# Patient Record
Sex: Female | Born: 1965 | Race: White | Hispanic: No | Marital: Married | Smoking: Never smoker
Health system: Southern US, Community
[De-identification: ages and names within clinical notes are randomized; demographics above are authoritative.]

## PROBLEM LIST (undated history)

## (undated) DIAGNOSIS — N200 Calculus of kidney: Secondary | ICD-10-CM

## (undated) DIAGNOSIS — G43909 Migraine, unspecified, not intractable, without status migrainosus: Secondary | ICD-10-CM

## (undated) DIAGNOSIS — J302 Other seasonal allergic rhinitis: Secondary | ICD-10-CM

## (undated) DIAGNOSIS — B019 Varicella without complication: Secondary | ICD-10-CM

## (undated) DIAGNOSIS — Z79890 Hormone replacement therapy (postmenopausal): Secondary | ICD-10-CM

## (undated) HISTORY — PX: OTHER SURGICAL HISTORY: SHX169

## (undated) HISTORY — DX: Calculus of kidney: N20.0

## (undated) HISTORY — DX: Other seasonal allergic rhinitis: J30.2

## (undated) HISTORY — PX: WISDOM TOOTH EXTRACTION: SHX21

## (undated) HISTORY — DX: Migraine, unspecified, not intractable, without status migrainosus: G43.909

## (undated) HISTORY — DX: Varicella without complication: B01.9

## (undated) HISTORY — DX: Hormone replacement therapy: Z79.890

---

## 1998-12-03 ENCOUNTER — Other Ambulatory Visit: Admission: RE | Admit: 1998-12-03 | Discharge: 1998-12-03 | Payer: Self-pay | Admitting: Obstetrics and Gynecology

## 1999-01-15 ENCOUNTER — Ambulatory Visit (HOSPITAL_COMMUNITY): Admission: RE | Admit: 1999-01-15 | Discharge: 1999-01-15 | Payer: Self-pay | Admitting: Obstetrics and Gynecology

## 1999-01-15 ENCOUNTER — Encounter: Payer: Self-pay | Admitting: Obstetrics and Gynecology

## 1999-02-04 ENCOUNTER — Encounter (INDEPENDENT_AMBULATORY_CARE_PROVIDER_SITE_OTHER): Payer: Self-pay | Admitting: Specialist

## 1999-02-04 ENCOUNTER — Other Ambulatory Visit: Admission: RE | Admit: 1999-02-04 | Discharge: 1999-02-04 | Payer: Self-pay | Admitting: Otolaryngology

## 1999-11-11 ENCOUNTER — Other Ambulatory Visit: Admission: RE | Admit: 1999-11-11 | Discharge: 1999-11-11 | Payer: Self-pay | Admitting: Obstetrics and Gynecology

## 2000-07-05 HISTORY — PX: OTHER SURGICAL HISTORY: SHX169

## 2000-11-15 ENCOUNTER — Other Ambulatory Visit: Admission: RE | Admit: 2000-11-15 | Discharge: 2000-11-15 | Payer: Self-pay | Admitting: Obstetrics and Gynecology

## 2001-11-20 ENCOUNTER — Other Ambulatory Visit: Admission: RE | Admit: 2001-11-20 | Discharge: 2001-11-20 | Payer: Self-pay | Admitting: Obstetrics and Gynecology

## 2002-11-26 ENCOUNTER — Other Ambulatory Visit: Admission: RE | Admit: 2002-11-26 | Discharge: 2002-11-26 | Payer: Self-pay | Admitting: Obstetrics and Gynecology

## 2003-12-04 ENCOUNTER — Other Ambulatory Visit: Admission: RE | Admit: 2003-12-04 | Discharge: 2003-12-04 | Payer: Self-pay | Admitting: Obstetrics and Gynecology

## 2004-12-09 ENCOUNTER — Other Ambulatory Visit: Admission: RE | Admit: 2004-12-09 | Discharge: 2004-12-09 | Payer: Self-pay | Admitting: Obstetrics and Gynecology

## 2006-01-13 ENCOUNTER — Other Ambulatory Visit: Admission: RE | Admit: 2006-01-13 | Discharge: 2006-01-13 | Payer: Self-pay | Admitting: Obstetrics and Gynecology

## 2009-09-08 ENCOUNTER — Emergency Department (HOSPITAL_COMMUNITY): Admission: EM | Admit: 2009-09-08 | Discharge: 2009-09-08 | Payer: Self-pay | Admitting: Emergency Medicine

## 2010-09-28 LAB — URINALYSIS, ROUTINE W REFLEX MICROSCOPIC
Bilirubin Urine: NEGATIVE
Glucose, UA: NEGATIVE mg/dL
Hgb urine dipstick: NEGATIVE
Ketones, ur: NEGATIVE mg/dL
Nitrite: NEGATIVE
Protein, ur: NEGATIVE mg/dL
Specific Gravity, Urine: 1.017 (ref 1.005–1.030)
Urobilinogen, UA: 0.2 mg/dL (ref 0.0–1.0)
pH: 6 (ref 5.0–8.0)

## 2010-09-28 LAB — COMPREHENSIVE METABOLIC PANEL
ALT: 23 U/L (ref 0–35)
AST: 21 U/L (ref 0–37)
Albumin: 3.7 g/dL (ref 3.5–5.2)
Alkaline Phosphatase: 57 U/L (ref 39–117)
BUN: 9 mg/dL (ref 6–23)
CO2: 25 mEq/L (ref 19–32)
Calcium: 8.7 mg/dL (ref 8.4–10.5)
Chloride: 108 mEq/L (ref 96–112)
Creatinine, Ser: 0.81 mg/dL (ref 0.4–1.2)
GFR calc Af Amer: >60 mL/min (ref >60)
GFR calc non Af Amer: >60 mL/min (ref >60)
Glucose, Bld: 105 mg/dL — ABNORMAL HIGH (ref 70–99)
Potassium: 3.7 mEq/L (ref 3.5–5.1)
Sodium: 138 mEq/L (ref 135–145)
Total Bilirubin: 0.7 mg/dL (ref 0.3–1.2)
Total Protein: 7.3 g/dL (ref 6.0–8.3)

## 2010-09-28 LAB — DIFFERENTIAL
Basophils Absolute: 0 10*3/uL (ref 0.0–0.1)
Basophils Relative: 0 % (ref 0–1)
Eosinophils Absolute: 0.1 10*3/uL (ref 0.0–0.7)
Eosinophils Relative: 1 % (ref 0–5)
Monocytes Absolute: 0.6 10*3/uL (ref 0.1–1.0)

## 2010-09-28 LAB — CBC
HCT: 43.6 % (ref 36.0–46.0)
Hemoglobin: 14.6 g/dL (ref 12.0–15.0)
MCHC: 33.6 g/dL (ref 30.0–36.0)
MCV: 89.6 fL (ref 78.0–100.0)
Platelets: 218 10*3/uL (ref 150–400)
RBC: 4.86 MIL/uL (ref 3.87–5.11)
RDW: 11.8 % (ref 11.5–15.5)
WBC: 6.8 10*3/uL (ref 4.0–10.5)

## 2010-09-28 LAB — PREGNANCY, URINE: Preg Test, Ur: NEGATIVE

## 2011-09-01 ENCOUNTER — Emergency Department (HOSPITAL_COMMUNITY)
Admission: EM | Admit: 2011-09-01 | Discharge: 2011-09-01 | Disposition: A | Discharge status: Home / Self Care | Payer: BC Managed Care – PPO | Source: Home / Self Care

## 2011-09-01 ENCOUNTER — Encounter (HOSPITAL_COMMUNITY): Payer: Self-pay | Admitting: Cardiology

## 2011-09-01 DIAGNOSIS — N200 Calculus of kidney: Secondary | ICD-10-CM

## 2011-09-01 LAB — POCT URINALYSIS DIP (DEVICE)
Leukocytes, UA: NEGATIVE
Nitrite: NEGATIVE
Protein, ur: NEGATIVE mg/dL
Urobilinogen, UA: 1 mg/dL (ref 0.0–1.0)
pH: 6.5 (ref 5.0–8.0)

## 2011-09-01 MED ORDER — HYDROCODONE-ACETAMINOPHEN 5-500 MG PO TABS: 1.0000 | ORAL_TABLET | Freq: Three times a day (TID) | ORAL | Status: AC | PRN

## 2011-09-01 NOTE — Discharge Instructions (Signed)

## 2011-09-01 NOTE — ED Notes (Signed)
Pt reports sudden onset of right sided flank pain at 330pm today. She has had some frequency to urinate. Pt was nauseated today but did not vomit. She was seen by her gynecologist a couple weeks ago for similar symptoms and thought she had a UTI all tests were negative at that time. Denies fever. Urine clear. Had episode of being hot during period of pain.

## 2011-09-01 NOTE — ED Provider Notes (Signed)
Kim Woodward is a 46 y.o. female who presents to Urgent Care today for acute onset of right flank pain that started at 3:30 this afternoon. Patient states she's had about 3 weeks of urinary urgency and hesitancy. She presented to her gynecologist about 3 weeks ago for similar symptoms and told that her UA at that time was negative for UTI. These symptoms have generally waned for the past 2 weeks. She states that she was out walking this morning when she had an episode of urgency. Again hesitancy when she tried to use the restroom. As stated above at 3:30 she was attempting to urinate when she had sharp stabbing pain in right flank that radiated through to the front of abdomen. She states it is colicky in nature. It lasted about 30 minutes and then resolved. She's not had any further episodes of pain since then. She's concerned she might have a kidney stone. She has no personal history of kidney stones but does have a strong family history. No abdominal pain prior to the episode today, no nausea no vomiting. No fevers or chills. No pain currently   PMH reviewed.  ROS as above otherwise neg Medications reviewed. No current facility-administered medications for this encounter.   Current Outpatient Prescriptions  Medication Sig Dispense Refill  . Nutritional Supplements (HRT SUPPORT PO) Place onto the skin 2 (two) times a week.        Exam:  BP 112/74  Pulse 71  Temp(Src) 98.6 F (37 C) (Oral)  Resp 18  SpO2 98%  LMP 10/30/2010 Gen: Well NAD HEENT: EOMI,  MMM Lungs: CTABL Nl WOB Heart: RRR no MRG Abd: NABS, NT, ND Exts: Non edematous BL  LE, warm and well perfused.  Back:  No CVA tenderness.  Assessment and Plan: #1. Nephrolithiasis: most likely etiology. UA did show large blood. Otherwise negative. It's possible issues I passed the stone as her pain has not returned in the past 4 hours. We provided her with a urine strainer here. Also provided her with short-term course of Vicodin  for pain relief. Recommended she followup in the next several days with urology particularly at stone recurs. As she is not currently having pain did not obtain any imaging. No symptoms of pyelonephritis.

## 2011-09-03 NOTE — ED Provider Notes (Signed)
Medical screening examination/treatment/procedure(s) were performed by resident physician or non-physician practitioner and as supervising physician I was immediately available for consultation/collaboration.   Braelynne Garinger DOUGLAS MD.    Contrina Orona Douglas Odessie Polzin, MD 09/03/11 1622 

## 2011-10-29 ENCOUNTER — Other Ambulatory Visit (HOSPITAL_COMMUNITY): Payer: Self-pay | Admitting: Obstetrics and Gynecology

## 2011-10-29 DIAGNOSIS — Z1231 Encounter for screening mammogram for malignant neoplasm of breast: Secondary | ICD-10-CM

## 2011-11-24 ENCOUNTER — Ambulatory Visit (HOSPITAL_COMMUNITY)
Admission: RE | Admit: 2011-11-24 | Discharge: 2011-11-24 | Disposition: A | Discharge status: Home / Self Care | Payer: BC Managed Care – PPO | Source: Ambulatory Visit | Attending: Obstetrics and Gynecology | Admitting: Obstetrics and Gynecology

## 2011-11-24 DIAGNOSIS — Z1231 Encounter for screening mammogram for malignant neoplasm of breast: Secondary | ICD-10-CM | POA: Insufficient documentation

## 2013-12-04 ENCOUNTER — Other Ambulatory Visit: Payer: Self-pay | Admitting: Obstetrics and Gynecology

## 2013-12-04 DIAGNOSIS — R928 Other abnormal and inconclusive findings on diagnostic imaging of breast: Secondary | ICD-10-CM

## 2013-12-17 ENCOUNTER — Ambulatory Visit
Admission: RE | Admit: 2013-12-17 | Discharge: 2013-12-17 | Disposition: A | Discharge status: Home / Self Care | Payer: BC Managed Care – PPO | Source: Ambulatory Visit | Attending: Obstetrics and Gynecology | Admitting: Obstetrics and Gynecology

## 2013-12-17 DIAGNOSIS — R928 Other abnormal and inconclusive findings on diagnostic imaging of breast: Secondary | ICD-10-CM

## 2016-01-19 ENCOUNTER — Other Ambulatory Visit: Payer: Self-pay | Admitting: Obstetrics and Gynecology

## 2016-01-19 DIAGNOSIS — R928 Other abnormal and inconclusive findings on diagnostic imaging of breast: Secondary | ICD-10-CM

## 2016-02-20 ENCOUNTER — Ambulatory Visit
Admission: RE | Admit: 2016-02-20 | Discharge: 2016-02-20 | Disposition: A | Discharge status: Home / Self Care | Payer: BC Managed Care – PPO | Source: Ambulatory Visit | Attending: Obstetrics and Gynecology | Admitting: Obstetrics and Gynecology

## 2016-02-20 DIAGNOSIS — R928 Other abnormal and inconclusive findings on diagnostic imaging of breast: Secondary | ICD-10-CM

## 2016-10-15 ENCOUNTER — Encounter: Payer: Self-pay | Admitting: Primary Care

## 2016-10-15 ENCOUNTER — Ambulatory Visit (INDEPENDENT_AMBULATORY_CARE_PROVIDER_SITE_OTHER)
Admission: RE | Admit: 2016-10-15 | Discharge: 2016-10-15 | Disposition: A | Discharge status: Home / Self Care | Payer: BC Managed Care – PPO | Source: Ambulatory Visit | Attending: Primary Care | Admitting: Primary Care

## 2016-10-15 ENCOUNTER — Encounter (INDEPENDENT_AMBULATORY_CARE_PROVIDER_SITE_OTHER): Payer: Self-pay

## 2016-10-15 ENCOUNTER — Ambulatory Visit (INDEPENDENT_AMBULATORY_CARE_PROVIDER_SITE_OTHER): Payer: BC Managed Care – PPO | Admitting: Primary Care

## 2016-10-15 VITALS — BP 116/76 | HR 69 | Temp 98.3°F | Ht 63.5 in | Wt 142.8 lb

## 2016-10-15 DIAGNOSIS — R05 Cough: Secondary | ICD-10-CM

## 2016-10-15 DIAGNOSIS — R053 Chronic cough: Secondary | ICD-10-CM | POA: Insufficient documentation

## 2016-10-15 MED ORDER — ALBUTEROL SULFATE HFA 108 (90 BASE) MCG/ACT IN AERS: INHALATION_SPRAY | RESPIRATORY_TRACT | 0 refills | Status: DC

## 2016-10-15 NOTE — Patient Instructions (Signed)
Complete xray(s) prior to leaving today. I will notify you of your results once received.  Shortness of Breath/Wheezing/Cough: Use the albuterol inhaler. Inhale 2 puffs into the lungs every 4-6 hours as needed for coughing spells.  Please update me in 1 week if no improvement in your symptoms. I'll be in touch soon!  Please schedule a physical with me sometime this year. You may also schedule a lab only appointment 3-4 days prior. We will discuss your lab results in detail during your physical.  It was a pleasure to meet you today! Please don't hesitate to call me with any questions. Welcome to [facility]!

## 2016-10-15 NOTE — Assessment & Plan Note (Signed)
Discussed common non infectious etiologies of chronic cough including allergies, GERD, asthma. Given her history of improvement with Flovent and lack of GERD symptoms this is a good place to start.   Will obtain chest xray today given duration. Continue antihistamine. Start Albuterol PRN for coughing spells. If no improvement then consider Zantac OTC.

## 2016-10-15 NOTE — Progress Notes (Signed)
Pre visit review using our clinic review tool, if applicable. No additional management support is needed unless otherwise documented below in the visit note. 

## 2016-10-15 NOTE — Progress Notes (Signed)
Subjective:    Patient ID: Kim Woodward, female    DOB: [birth year], 51 y.o.   MRN: [id number]  HPI  Kim Woodward is a 51 year old female who presents today to establish care and discuss the problems mentioned below. Will obtain old records.  1) Chronic Cough: Present since late February/earlyMarch this year. Her cough is intermittent. She's also noticed mild headaches, mild sinus pressure. She's not had treatment for this cough. She does have a history of bronchitis but this feels different. Early on she felt as though she did have bronchitis.   She will experience coughing spells that occur throughout the day (mid morning, mid afternoon, and in the evening) with a productive cough (yellowish thick mucous plug, sometimes clear). She denies fevers, sore throat, fatigue, wheezing, shortness of breath, esophageal reflux symptoms, history of asthma.  She's tried taking Mucinex, Robitussin, Sudafed, saline nasal sprays, a Flovent inhaler from an old prescription, Zyrtec. She is taking some form of an antihistamine daily. The Flovent did help early on as she took for three weeks, she stopped using this and didn't notice any change in her symptoms. Historically she's noticed improvement with Flovent.   Overall she has noticed some improvement in cough, but is still bothered by coughing spells.   2) Hormone Replacement Therapy: Went through menopause at age 41. She is using the Combipatch for hormone replacement and changes twice weekly. She follows with her GYN annually who prescribes this.  Review of Systems  Constitutional: Negative for chills, fatigue and fever.  HENT: Positive for sinus pressure. Negative for congestion and ear pain.   Respiratory: Positive for cough. Negative for shortness of breath and wheezing.   Cardiovascular: Negative for chest pain.  Allergic/Immunologic: Positive for environmental allergies.  Neurological: Positive for headaches.       Past Medical  History:  Diagnosis Date  . Chickenpox   . Kidney stones   . Migraine   . Seasonal allergies      Social History   Social History  . Marital status: Married    Spouse name: N/A  . Number of children: N/A  . Years of education: N/A   Occupational History  . Not on file.   Social History Main Topics  . Smoking status: Never Smoker  . Smokeless tobacco: Never Used  . Alcohol use Yes  . Drug use: Unknown  . Sexual activity: Not on file   Other Topics Concern  . Not on file   Social History Narrative   Married.   1 child.   Works in Programme researcher, broadcasting/film/video.   Enjoys photography, spending time with family, piano.    Past Surgical History:  Procedure Laterality Date  . uterine polyps      Family History  Problem Relation Age of Onset  . Arthritis Maternal Grandmother   . Colon cancer Maternal Grandfather   . Hyperlipidemia Paternal Grandmother     No Known Allergies  No current outpatient prescriptions on file prior to visit.   No current facility-administered medications on file prior to visit.     BP 116/76   Pulse 69   Temp 98.3 F (36.8 C) (Oral)   Ht 5' 3.5" (1.613 m)   Wt 142 lb 12.8 oz (64.8 kg)   LMP 11/17/2011   SpO2 99%   BMI 24.90 kg/m    Objective:   Physical Exam  Constitutional: She appears well-nourished.  HENT:  Right Ear: Tympanic membrane and ear canal normal.  Left Ear: Tympanic membrane  and ear canal normal.  Nose: Right sinus exhibits no maxillary sinus tenderness and no frontal sinus tenderness. Left sinus exhibits no maxillary sinus tenderness and no frontal sinus tenderness.  Mouth/Throat: Oropharynx is clear and moist.  Eyes: Conjunctivae are normal.  Neck: Neck supple.  Cardiovascular: Normal rate and regular rhythm.   Pulmonary/Chest: Effort normal and breath sounds normal. She has no wheezes. She has no rales.  Lymphadenopathy:    She has no cervical adenopathy.  Skin: Skin is warm and dry.          Assessment & Plan:

## 2017-04-19 ENCOUNTER — Ambulatory Visit: Payer: BC Managed Care – PPO

## 2017-12-10 IMAGING — US ULTRASOUND LEFT BREAST LIMITED
1 series · 5 of 5 positions shown · non-contrast
Comparison: Previous exam(s).

CLINICAL DATA: Left breast asymmetry seen on most recent screening
mammography.

EXAM:
2D DIGITAL DIAGNOSTIC LEFT MAMMOGRAM WITH CAD AND ADJUNCT TOMO
ULTRASOUND LEFT BREAST

[Series 1: ultrasound left breast limited · 0.06mm/px · 5 of 5 slices shown]
[im 1/5]
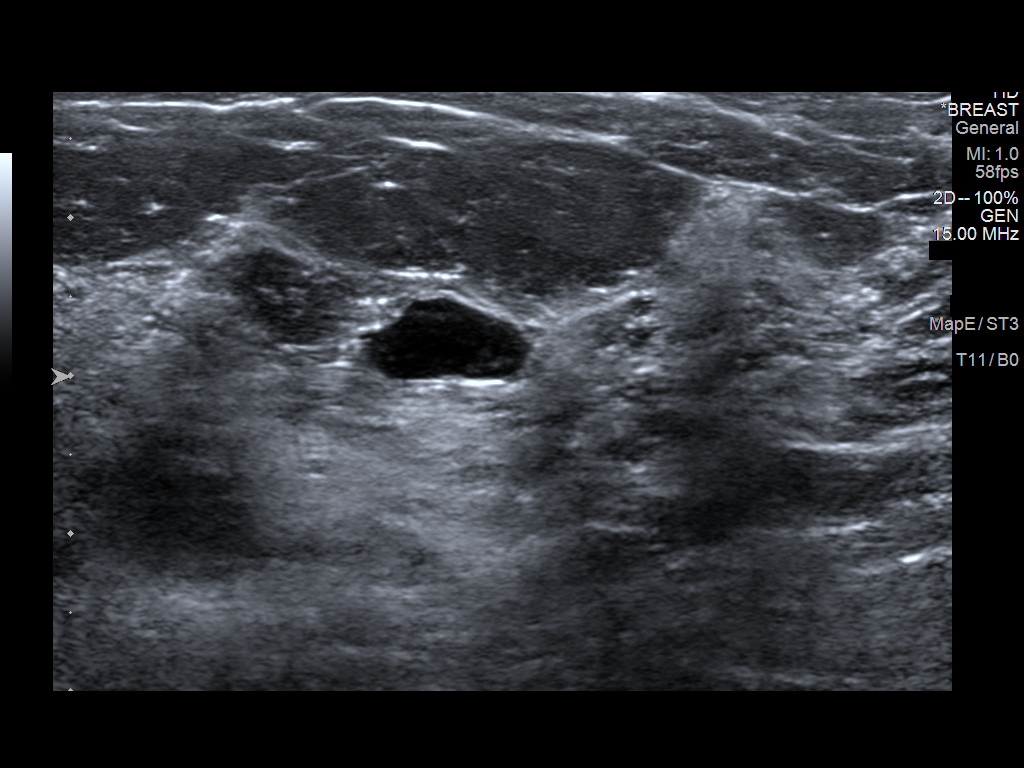
[im 2/5]
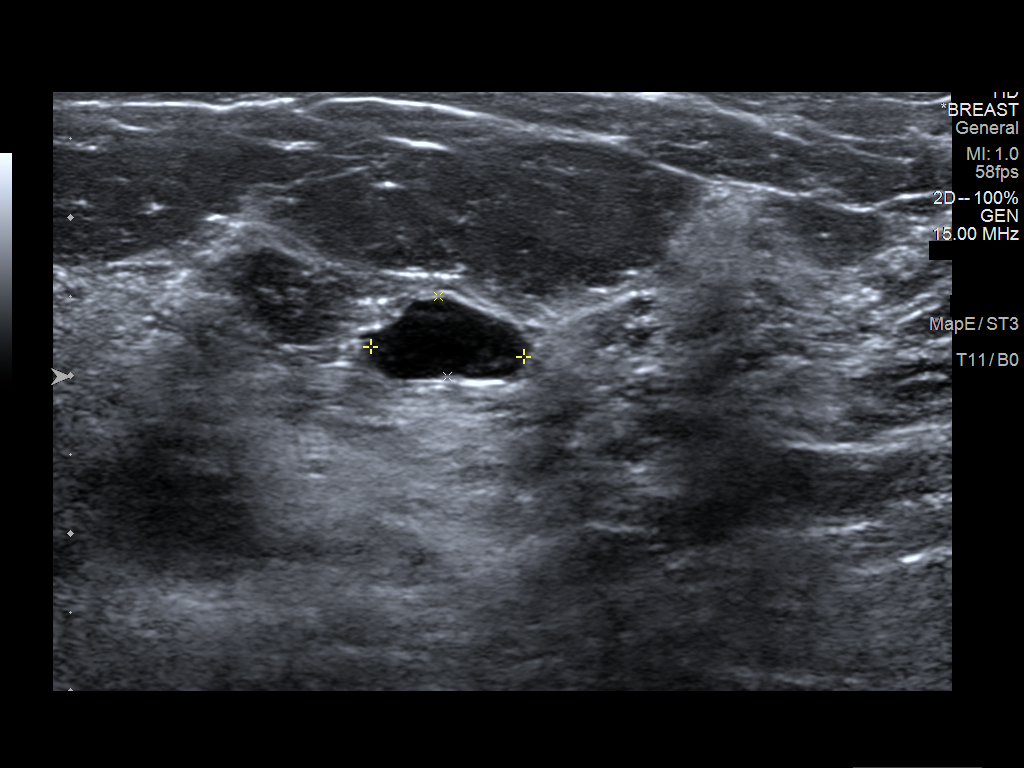
[im 3/5]
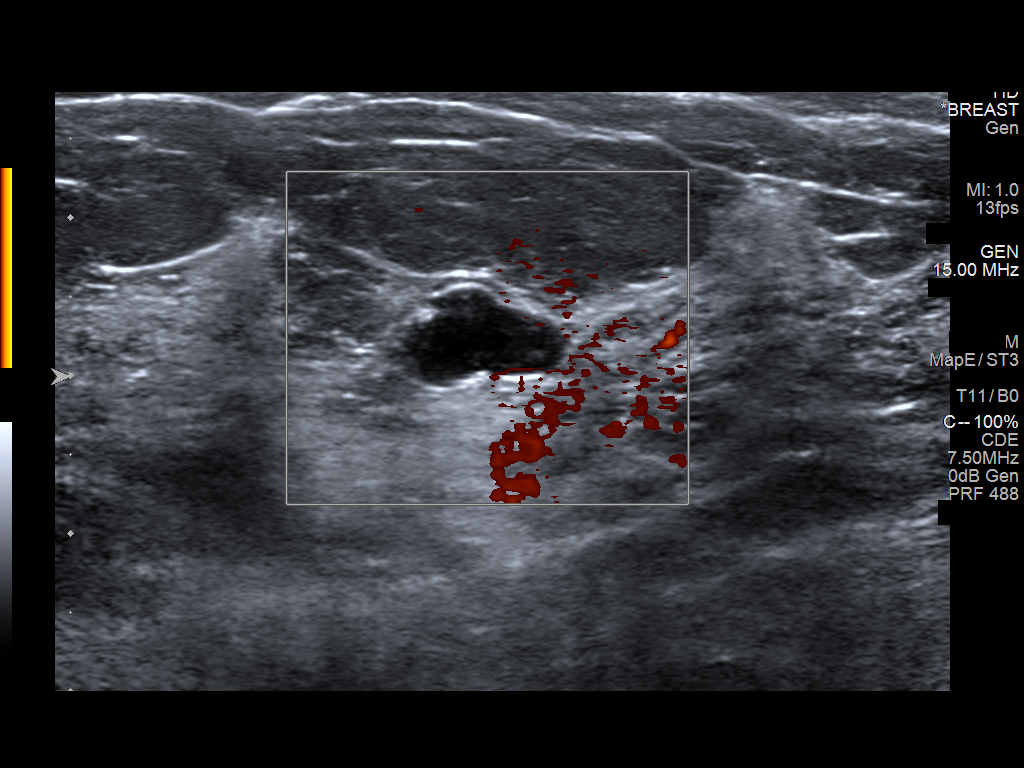
[im 4/5]
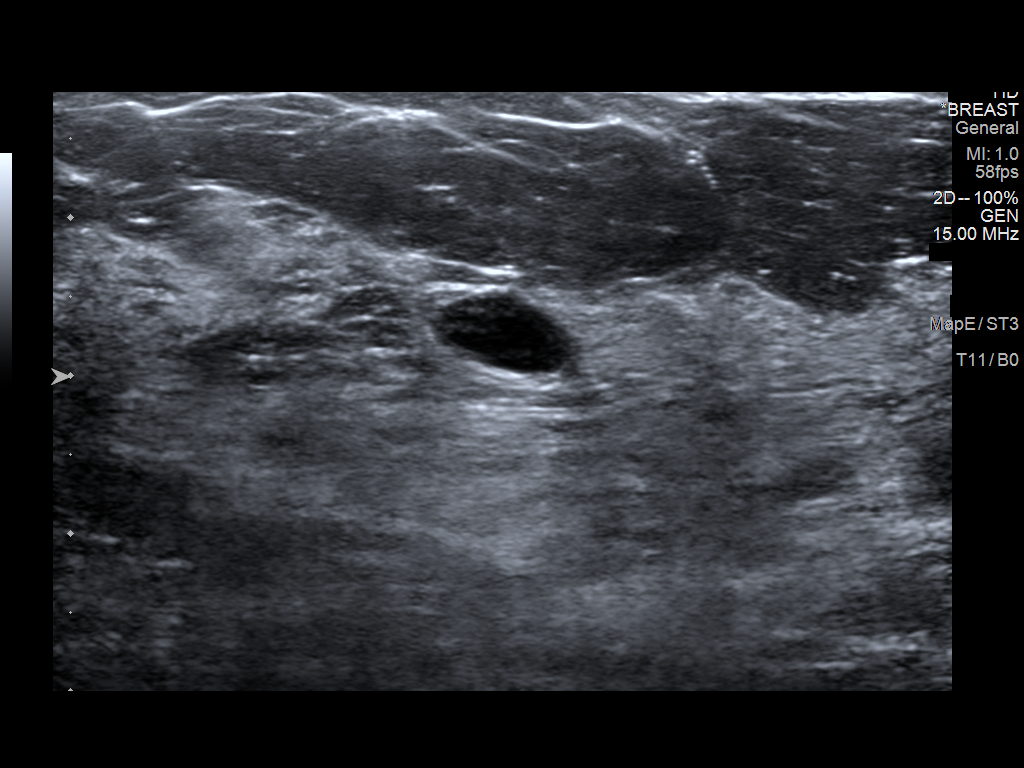
[im 5/5]
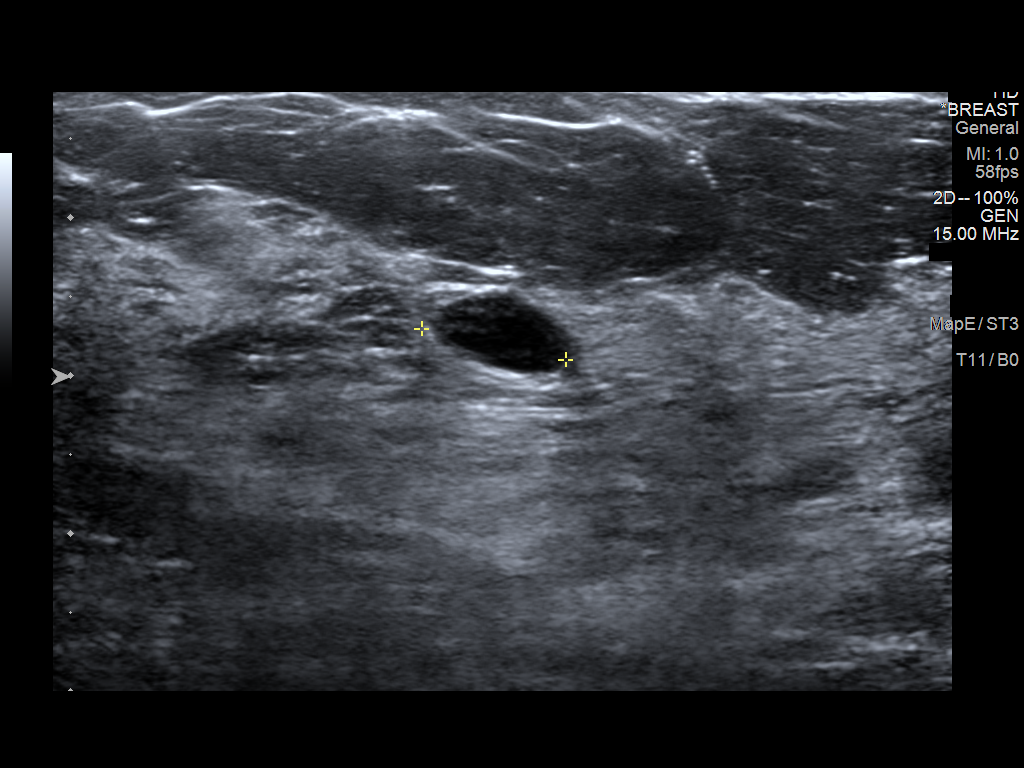

[5 of 5 positions shown; findings below may reference images not displayed]

ACR Breast Density Category c: The breast tissue is heterogeneously
dense, which may obscure small masses.
FINDINGS: Mammographically, there is a persistent circumscribed low-density
mass in the left slightly upper outer quadrant, middle depth, with
benign mammographic features.

Mammographic images were processed with CAD.

On physical exam, no suspicious findings.

Targeted ultrasound is performed, showing left breast 2 o'clock 2 cm
from the nipple benign-appearing cyst measuring 1.0 x 0.5 x 0.9 cm.
This finding corresponds to the mammographically seen mass.
IMPRESSION: No mammographic evidence of malignancy in the left breast.

Benign-appearing left breast cyst.

RECOMMENDATION:
Screening mammogram in one year.(Code:OA-4-HMR)

I have discussed the findings and recommendations with the patient.
Results were also provided in writing at the conclusion of the
visit. If applicable, a reminder letter will be sent to the patient
regarding the next appointment.

BI-RADS CATEGORY  2: Benign.

## 2018-06-09 ENCOUNTER — Ambulatory Visit: Payer: BC Managed Care – PPO | Admitting: Family Medicine

## 2018-06-09 ENCOUNTER — Encounter: Payer: Self-pay | Admitting: Family Medicine

## 2018-06-09 VITALS — BP 108/70 | HR 80 | Temp 98.7°F | Ht 63.5 in | Wt 145.8 lb

## 2018-06-09 DIAGNOSIS — J029 Acute pharyngitis, unspecified: Secondary | ICD-10-CM | POA: Insufficient documentation

## 2018-06-09 LAB — POCT RAPID STREP A (OFFICE): Rapid Strep A Screen: NEGATIVE

## 2018-06-09 NOTE — Progress Notes (Signed)
BP 108/70   Pulse 80   Temp 98.7 F (37.1 C) (Oral)   Ht 5' 3.5" (1.613 m)   Wt 145 lb 12 oz (66.1 kg)   LMP 11/17/2011   SpO2 98%   BMI 25.41 kg/m    CC: "very very sore throat" Subjective:    Patient ID: Kim Woodward, female    DOB: 03-15-1966, 52 y.o.   MRN: [id number]  HPI: Kim Woodward is a 52 y.o. female presenting on 06/09/2018 for Possible Strep   3d h/o bad ST, mild congestion, rhinorrhea, minimal cough. Yesterday had significant back pain. Some pressure in ears today.   No fevers/chills, headache, dyspnea or wheezing, tooth pain, PNdrainage. No abd pain or nausea.   Treating with ibuprofen with benefit. Tried airborn and zinc.   No sick contacts at home.  She did receive flu shot.  Non smoker  No h/o asthma.   Relevant past medical, surgical, family and social history reviewed and updated as indicated. Interim medical history since our last visit reviewed. Allergies and medications reviewed and updated. Outpatient Medications Prior to Visit  Medication Sig Dispense Refill  . albuterol (PROAIR HFA) 108 (90 Base) MCG/ACT inhaler Inhale 2 puffs into the lungs every four hours as needed for coughing spells. 1 Inhaler 0  . cetirizine (ZYRTEC) 10 MG tablet Take 10 mg by mouth daily.    . COMBIPATCH 0.05-0.14 MG/DAY Place 1 patch onto the skin 2 (two) times a week.     . fluticasone (FLONASE) 50 MCG/ACT nasal spray Place 1 spray into both nostrils daily.    . Multiple Vitamin (MULTIVITAMIN) tablet Take 1 tablet by mouth daily.     No facility-administered medications prior to visit.      Per HPI unless specifically indicated in ROS section below Review of Systems     Objective:    BP 108/70   Pulse 80   Temp 98.7 F (37.1 C) (Oral)   Ht 5' 3.5" (1.613 m)   Wt 145 lb 12 oz (66.1 kg)   LMP 11/17/2011   SpO2 98%   BMI 25.41 kg/m   Wt Readings from Last 3 Encounters:  06/09/18 145 lb 12 oz (66.1 kg)  10/15/16 142 lb 12.8 oz (64.8 kg)    Physical Exam  Constitutional: She appears well-developed and well-nourished. No distress.  HENT:  Head: Normocephalic and atraumatic.  Right Ear: Hearing, tympanic membrane, external ear and ear canal normal.  Left Ear: Hearing, tympanic membrane, external ear and ear canal normal.  Nose: Mucosal edema and rhinorrhea present. Right sinus exhibits no maxillary sinus tenderness and no frontal sinus tenderness. Left sinus exhibits no maxillary sinus tenderness and no frontal sinus tenderness.  Mouth/Throat: Uvula is midline and mucous membranes are normal. Posterior oropharyngeal edema and posterior oropharyngeal erythema present. No oropharyngeal exudate or tonsillar abscesses.  Eyes: Pupils are equal, round, and reactive to light. Conjunctivae and EOM are normal. No scleral icterus.  Neck: Normal range of motion. Neck supple.  Cardiovascular: Normal rate, regular rhythm, normal heart sounds and intact distal pulses.  No murmur heard. Pulmonary/Chest: Effort normal and breath sounds normal. No respiratory distress. She has no wheezes. She has no rales.  Lymphadenopathy:    She has no cervical adenopathy.  Skin: Skin is warm and dry. No rash noted.  Nursing note and vitals reviewed.  Results for orders placed or performed in visit on 06/09/18  POCT rapid strep A  Result Value Ref Range   Rapid Strep  A Screen Negative Negative      Assessment & Plan:   Problem List Items Addressed This Visit    Acute pharyngitis - Primary    Suspicious for strep pharyngitis given lack of URI symptoms and severity of ST, vs flu like illness. RST today negative. Will send throat culture.  Supportive care reviewed as per instructions including alternating tylenol/ibuprofen.  Update if not improving with treatment.       Relevant Orders   Culture, Group A Strep   POCT rapid strep A (Completed)       No orders of the defined types were placed in this encounter.  Orders Placed This Encounter   Procedures  . Culture, Group A Strep    Order Specific Question:   Source    Answer:   throat  . POCT rapid strep A    Follow up plan: No follow-ups on file.  Eustaquio BoydenJavier Jahniya Duzan, MD

## 2018-06-09 NOTE — Assessment & Plan Note (Addendum)
Suspicious for strep pharyngitis given lack of URI symptoms and severity of ST, vs flu like illness. RST today negative. Will send throat culture.  Supportive care reviewed as per instructions including alternating tylenol/ibuprofen.  Update if not improving with treatment.

## 2018-06-09 NOTE — Patient Instructions (Addendum)
Strep test returned negative. We will send strep culture as well.  You do have acute pharyngitis.  Push fluids and plenty of rest. Continue ibuprofen 600mg  with meals, may alternate every 4 hours with tylenol 1000mg  three times a day.  Salt water gargles. Suck on cold things like popsicles or warm things like herbal teas (whichever soothes the throat better).  Return if fever >101.5, worsening pain, or trouble opening/closing mouth, or hoarse voice. Good to see you today, call clinic with questions.

## 2018-06-11 LAB — CULTURE, GROUP A STREP
MICRO NUMBER:: 91463890
SPECIMEN QUALITY:: ADEQUATE

## 2018-12-13 ENCOUNTER — Ambulatory Visit: Payer: BC Managed Care – PPO | Admitting: Primary Care

## 2018-12-13 ENCOUNTER — Encounter: Payer: Self-pay | Admitting: Primary Care

## 2018-12-13 ENCOUNTER — Telehealth: Payer: BC Managed Care – PPO | Admitting: Nurse Practitioner

## 2018-12-13 ENCOUNTER — Ambulatory Visit (INDEPENDENT_AMBULATORY_CARE_PROVIDER_SITE_OTHER): Payer: BC Managed Care – PPO | Admitting: Primary Care

## 2018-12-13 DIAGNOSIS — L299 Pruritus, unspecified: Secondary | ICD-10-CM

## 2018-12-13 NOTE — Progress Notes (Signed)
Subjective:    Patient ID: Kim Woodward, female    DOB: Feb 18, 1966, 53 y.o.   MRN: [id number]  HPI  Virtual Visit via Video Note  I connected with Kim Woodward on 12/13/18 at  3:40 PM EDT by a video enabled telemedicine application and verified that I am speaking with the correct person using two identifiers.  Location: Patient: Home Provider: Office   I discussed the limitations of evaluation and management by telemedicine and the availability of in person appointments. The patient expressed understanding and agreed to proceed.  History of Present Illness:  Ms. Kim Woodward is a 53 year old female who presents today with a chief complaint of itchy scalp.  Her itchy sensation is located to the upper through lower bilateral occipital scalp. She will notice infrequent mild tingling and burning sensation if she accidentally hits her head with the hair dryer, but her main symptom is itching. She's had her husband look at the skin to her scalp and denies rashes, dry skin, flaking, lesions. She's not changed soaps, detergents, shampoo/conditioner. She's tried using dandruff shampoo and reducing the frequency of washing her hair without improvement.  She is compliant to her Zyrtec daily. Today she's feeling nearly 100% better. No one else in her house is itching.     Observations/Objective:  Alert and oriented. Appears well, not sickly. No distress. Speaking in complete sentences. No obvious rash or lesion.  Assessment and Plan:  Unclear etiology. No rash, lesions, dry skin. Doesn't appear to be shingles, but will keep on differential list.  Today she her symptoms have nearly resolved. If itching returns consider adding Pepcid and or treating for tinea capitus.  If burning becomes more pronounced then consider Valtrex course. She will update later this week.   Follow Up Instructions:  Please update me if your itching returns, you notice more burning, you notice  lesions or rashes.  It was a pleasure to see you today!   I discussed the assessment and treatment plan with the patient. The patient was provided an opportunity to ask questions and all were answered. The patient agreed with the plan and demonstrated an understanding of the instructions.   The patient was advised to call back or seek an in-person evaluation if the symptoms worsen or if the condition fails to improve as anticipated.    Pleas Koch, NP    Review of Systems  Constitutional: Negative for fever.  Skin: Negative for rash and wound.       Itchy skin to scalp  Neurological: Negative for numbness.       Past Medical History:  Diagnosis Date  . Chickenpox   . Hormone replacement therapy (HRT)   . Kidney stones   . Migraine   . Seasonal allergies      Social History   Socioeconomic History  . Marital status: Married    Spouse name: Not on file  . Number of children: Not on file  . Years of education: Not on file  . Highest education level: Not on file  Occupational History  . Not on file  Social Needs  . Financial resource strain: Not on file  . Food insecurity:    Worry: Not on file    Inability: Not on file  . Transportation needs:    Medical: Not on file    Non-medical: Not on file  Tobacco Use  . Smoking status: Never Smoker  . Smokeless tobacco: Never Used  Substance and Sexual Activity  .  Alcohol use: Yes  . Drug use: Not on file  . Sexual activity: Not on file  Lifestyle  . Physical activity:    Days per week: Not on file    Minutes per session: Not on file  . Stress: Not on file  Relationships  . Social connections:    Talks on phone: Not on file    Gets together: Not on file    Attends religious service: Not on file    Active member of club or organization: Not on file    Attends meetings of clubs or organizations: Not on file    Relationship status: Not on file  . Intimate partner violence:    Fear of current or ex partner:  Not on file    Emotionally abused: Not on file    Physically abused: Not on file    Forced sexual activity: Not on file  Other Topics Concern  . Not on file  Social History Narrative   Married.   1 child.   Works in Programme researcher, broadcasting/film/videoducation.   Enjoys photography, spending time with family, piano.    Past Surgical History:  Procedure Laterality Date  . uterine polyps      Family History  Problem Relation Age of Onset  . Arthritis Maternal Grandmother   . Colon cancer Maternal Grandfather   . Hyperlipidemia Paternal Grandmother     No Known Allergies  Current Outpatient Medications on File Prior to Visit  Medication Sig Dispense Refill  . albuterol (PROAIR HFA) 108 (90 Base) MCG/ACT inhaler Inhale 2 puffs into the lungs every four hours as needed for coughing spells. 1 Inhaler 0  . cetirizine (ZYRTEC) 10 MG tablet Take 10 mg by mouth daily.    . COMBIPATCH 0.05-0.14 MG/DAY Place 1 patch onto the skin 2 (two) times a week.     . fluticasone (FLONASE) 50 MCG/ACT nasal spray Place 1 spray into both nostrils daily.    . Multiple Vitamin (MULTIVITAMIN) tablet Take 1 tablet by mouth daily.     No current facility-administered medications on file prior to visit.     Wt 150 lb (68 kg)   LMP 11/17/2011   BMI 26.15 kg/m    Objective:   Physical Exam  Constitutional: She is oriented to person, place, and time. She appears well-nourished.  Respiratory: Effort normal.  Neurological: She is alert and oriented to person, place, and time.  Skin: No rash noted.  No flaking skin  Psychiatric: She has a normal mood and affect.           Assessment & Plan:

## 2018-12-13 NOTE — Patient Instructions (Signed)
Please update me if your itching returns, you notice more burning, you notice lesions or rashes.  It was a pleasure to see you today!

## 2018-12-13 NOTE — Assessment & Plan Note (Signed)
Unclear etiology. No rash, lesions, dry skin. Doesn't appear to be shingles, but will keep on differential list.  Today she her symptoms have nearly resolved. If itching returns consider adding Pepcid and or treating for tinea capitus.  If burning becomes more pronounced then consider Valtrex course. She will update later this week.

## 2018-12-13 NOTE — Progress Notes (Signed)
Based on what you shared with me it looks like you have an itch,that should be evaluated in a face to face office visit. I cannot determine cause without being seen. I do not feel lik ethis is an emergency that you need to go to ER or urgent care. Just call your PCP and make appointment.   NOTE: If you entered your credit card information for this eVisit, you will not be charged. You may see a "hold" on your card for the $30 but that hold will drop off and you will not have a charge processed.  If you are having a true medical emergency please call 911.  If you need an urgent face to face visit, [facility] has four urgent care centers for your convenience.

## 2019-01-03 ENCOUNTER — Telehealth: Payer: Self-pay | Admitting: Primary Care

## 2019-01-03 NOTE — Telephone Encounter (Signed)
Please call pt to set up shingrix vaccine from wait list. Best dates are 7/14, 7/21, 7/28, 8/4, 8/11. Slots are 1130, 1145, 1200, 230, 245, 300, 315, 330. °

## 2019-01-10 NOTE — Telephone Encounter (Signed)
Lvm asking pt to call office 

## 2019-01-22 NOTE — Telephone Encounter (Signed)
lvm asking patient to call office °

## 2019-04-20 LAB — HM MAMMOGRAPHY

## 2019-05-02 LAB — HM PAP SMEAR: HM Pap smear: NORMAL

## 2019-05-03 ENCOUNTER — Other Ambulatory Visit: Payer: Self-pay | Admitting: Obstetrics and Gynecology

## 2019-05-03 DIAGNOSIS — N6310 Unspecified lump in the right breast, unspecified quadrant: Secondary | ICD-10-CM

## 2019-05-15 ENCOUNTER — Other Ambulatory Visit: Payer: BC Managed Care – PPO

## 2019-05-22 ENCOUNTER — Other Ambulatory Visit: Payer: Self-pay

## 2019-05-22 ENCOUNTER — Ambulatory Visit
Admission: RE | Admit: 2019-05-22 | Discharge: 2019-05-22 | Disposition: A | Discharge status: Home / Self Care | Payer: BC Managed Care – PPO | Source: Ambulatory Visit | Attending: Obstetrics and Gynecology | Admitting: Obstetrics and Gynecology

## 2019-05-22 DIAGNOSIS — N6310 Unspecified lump in the right breast, unspecified quadrant: Secondary | ICD-10-CM

## 2020-01-04 ENCOUNTER — Ambulatory Visit: Payer: BC Managed Care – PPO

## 2020-05-09 ENCOUNTER — Encounter: Payer: Self-pay | Admitting: Primary Care

## 2020-05-12 ENCOUNTER — Encounter: Payer: Self-pay | Admitting: Primary Care

## 2021-03-11 IMAGING — US US BREAST*R* LIMITED INC AXILLA
1 series · 7 of 7 positions shown · non-contrast
Comparison: Previous exam(s).

CLINICAL DATA: Screening recall for a possible right breast mass.

EXAM:
ULTRASOUND OF THE RIGHT BREAST

[Series 1: us breast*right* limited inc axilla · 0.06mm/px · 7 of 7 slices shown]
[im 1/7]
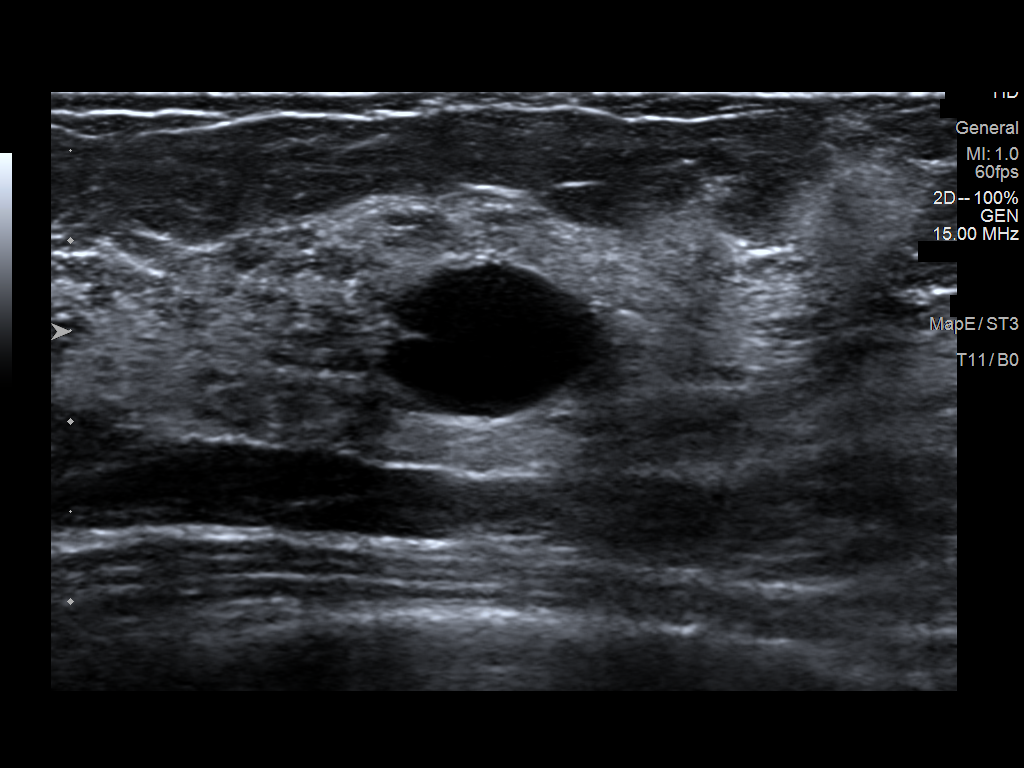
[im 2/7]
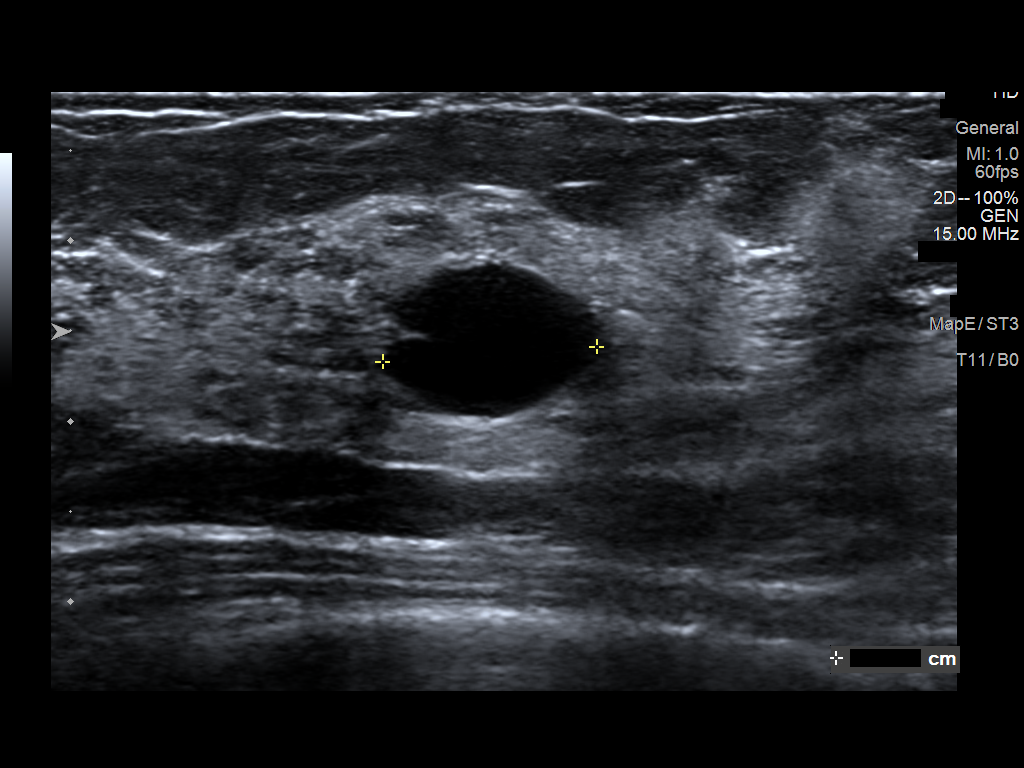
[im 3/7]
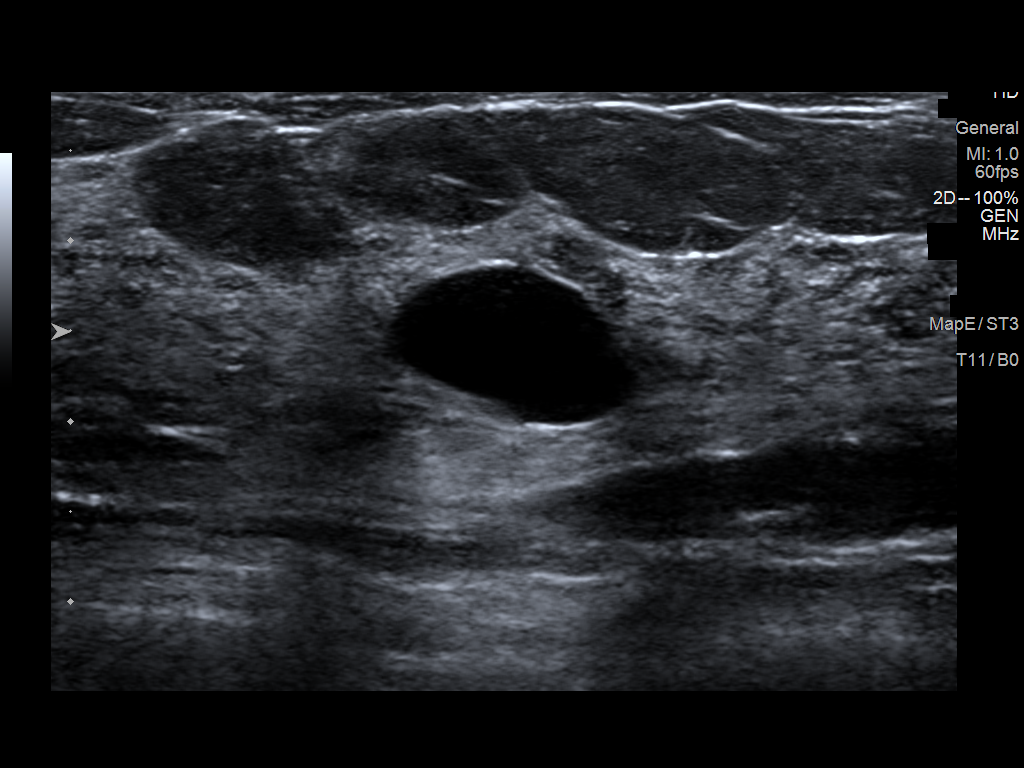
[im 4/7]
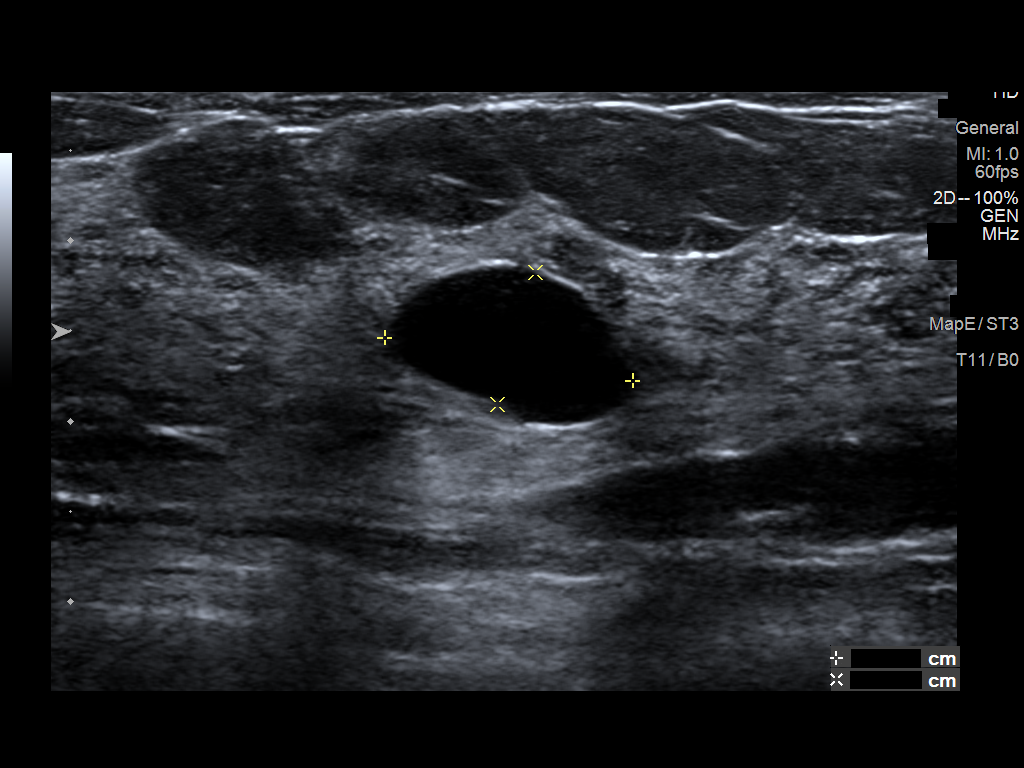
[im 5/7]
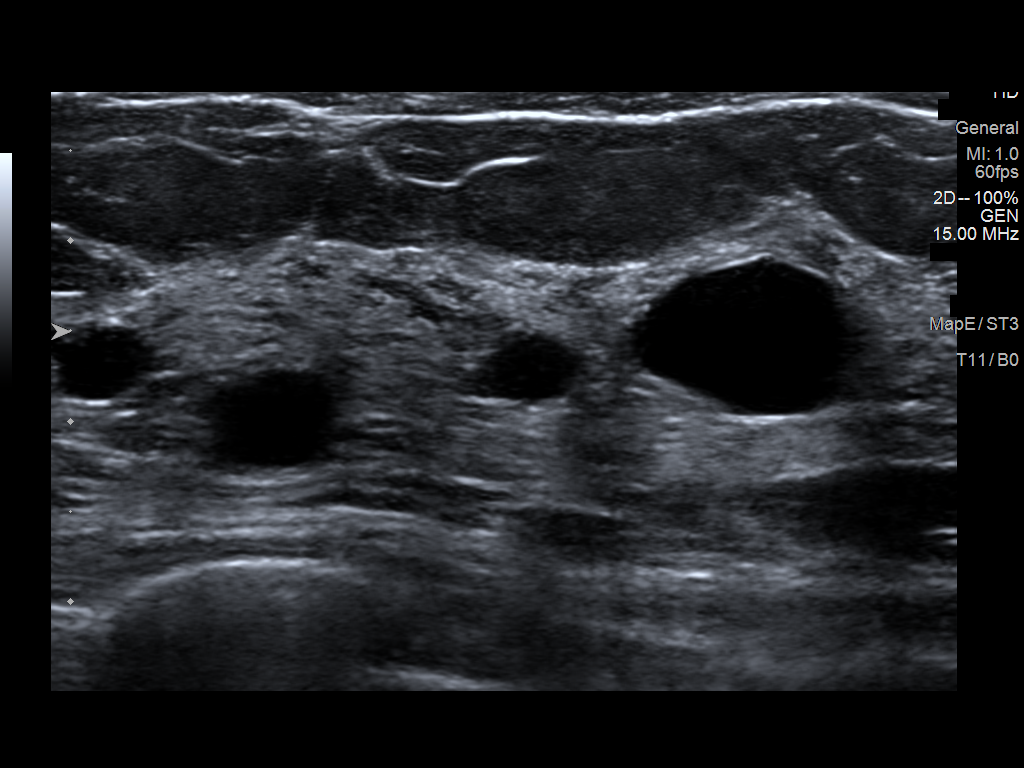
[im 6/7]
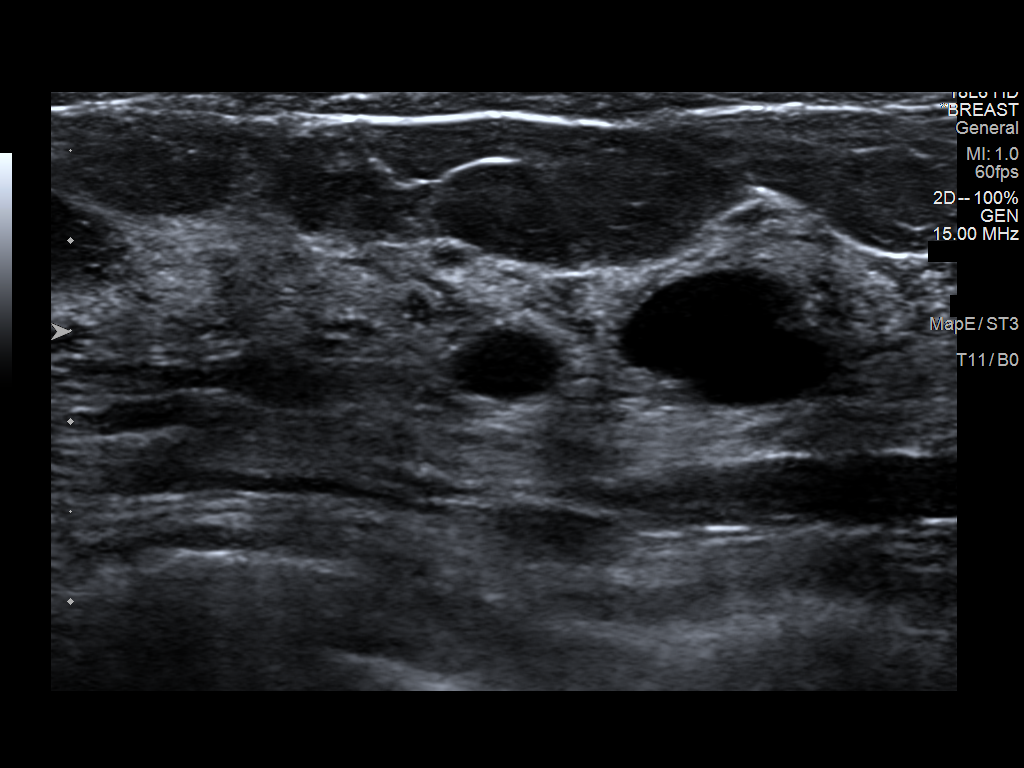
[im 7/7]
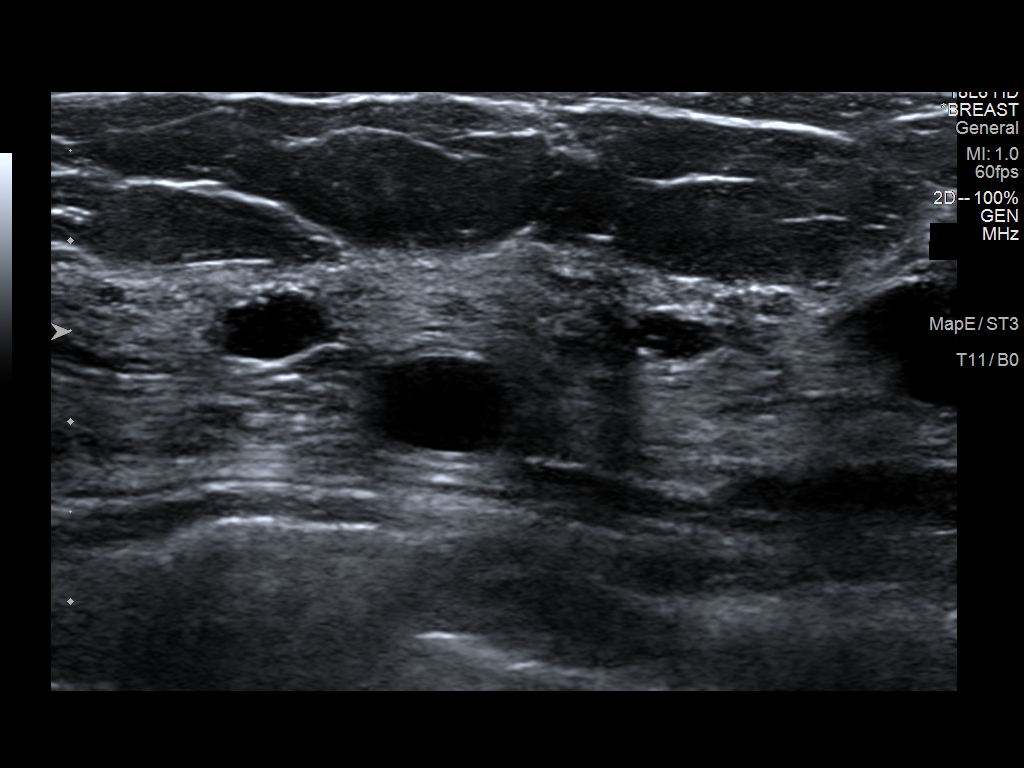

[7 of 7 positions shown; findings below may reference images not displayed]

FINDINGS: Ultrasound of the right breast at 10 o'clock demonstrates multiple
anechoic oval circumscribed masses. The largest measures 1.4 x 0.8 x
1.2 cm.
IMPRESSION: The mass in the right breast on the screening mammogram corresponds
with a benign cyst.

RECOMMENDATION:
Screening mammogram in one year.(Code:AV-P-ZE1)

I have discussed the findings and recommendations with the patient.
If applicable, a reminder letter will be sent to the patient
regarding the next appointment.

BI-RADS CATEGORY  2: Benign.

## 2021-10-30 ENCOUNTER — Encounter: Payer: Self-pay | Admitting: Family Medicine

## 2021-10-30 ENCOUNTER — Ambulatory Visit: Payer: BC Managed Care – PPO | Admitting: Family Medicine

## 2021-10-30 VITALS — BP 90/60 | HR 77 | Temp 98.3°F | Ht 63.5 in | Wt 135.2 lb

## 2021-10-30 DIAGNOSIS — Z20818 Contact with and (suspected) exposure to other bacterial communicable diseases: Secondary | ICD-10-CM | POA: Diagnosis not present

## 2021-10-30 DIAGNOSIS — L539 Erythematous condition, unspecified: Secondary | ICD-10-CM | POA: Diagnosis not present

## 2021-10-30 LAB — POCT RAPID STREP A (OFFICE): Rapid Strep A Screen: NEGATIVE

## 2021-10-30 MED ORDER — AZITHROMYCIN 250 MG PO TABS: ORAL_TABLET | ORAL | 0 refills | Status: DC

## 2021-10-30 NOTE — Progress Notes (Signed)
? ? Patient ID: Kim Woodward, female    DOB: 05-Apr-1966, 56 y.o.   MRN: [id number] ? ?This visit was conducted in person. ? ?BP 90/60   Pulse 77   Temp 98.3 ?F (36.8 ?C) (Oral)   Ht 5' 3.5" (1.613 m)   Wt 135 lb 4 oz (61.3 kg)   LMP 11/17/2011   SpO2 98%   BMI 23.58 kg/m?   ? ?CC:  ?Chief Complaint  ?Patient presents with  ? Red Throat  ?  Family Members have tested positive for strep  ? ? ?Subjective:  ? ?HPI: ?Kim Woodward is a 56 y.o. female presenting on 10/30/2021 for Red Throat (Family Members have tested positive for strep) ? ? ? Family started with ST in last several weeks. ? Multiple family members tested positive in last several weeks. ? ? Date of onset:   She does not feel bad but noted red throat x 2 days. ? Some  white mucus on tonsils. ? NO ST, no fever  ? No congestion, no cough. ? ?  Has not taken any medication. ? ?   ? ?Relevant past medical, surgical, family and social history reviewed and updated as indicated. Interim medical history since our last visit reviewed. ?Allergies and medications reviewed and updated. ?Outpatient Medications Prior to Visit  ?Medication Sig Dispense Refill  ? Ascorbic Acid (VITAMIN C) 1000 MG tablet Take 1,000 mg by mouth daily.    ? cetirizine (ZYRTEC) 10 MG tablet Take 10 mg by mouth daily.    ? Cholecalciferol (VITAMIN D3) 50 MCG (2000 UT) TABS Take 1 tablet by mouth daily.    ? COLLAGEN PO Take 2 tablets by mouth daily.    ? COMBIPATCH 0.05-0.14 MG/DAY Place 1 patch onto the skin 2 (two) times a week.     ? fluticasone (FLONASE) 50 MCG/ACT nasal spray Place 1 spray into both nostrils daily.    ? GLUCOSAMINE-CHONDROITIN PO Take 1 tablet by mouth daily.    ? Multiple Vitamin (MULTIVITAMIN) tablet Take 1 tablet by mouth daily.    ? albuterol (PROAIR HFA) 108 (90 Base) MCG/ACT inhaler Inhale 2 puffs into the lungs every four hours as needed for coughing spells. 1 Inhaler 0  ? ?No facility-administered medications prior to visit.  ?  ? ?Per HPI  unless specifically indicated in ROS section below ?Review of Systems  ?Constitutional:  Negative for fatigue and fever.  ?HENT:  Negative for congestion.   ?Eyes:  Negative for pain.  ?Respiratory:  Negative for cough and shortness of breath.   ?Cardiovascular:  Negative for chest pain, palpitations and leg swelling.  ?Gastrointestinal:  Negative for abdominal pain.  ?Genitourinary:  Negative for dysuria and vaginal bleeding.  ?Musculoskeletal:  Negative for back pain.  ?Neurological:  Negative for syncope, light-headedness and headaches.  ?Psychiatric/Behavioral:  Negative for dysphoric mood.   ?Objective:  ?BP 90/60   Pulse 77   Temp 98.3 ?F (36.8 ?C) (Oral)   Ht 5' 3.5" (1.613 m)   Wt 135 lb 4 oz (61.3 kg)   LMP 11/17/2011   SpO2 98%   BMI 23.58 kg/m?   ?Wt Readings from Last 3 Encounters:  ?10/30/21 135 lb 4 oz (61.3 kg)  ?12/13/18 150 lb (68 kg)  ?06/09/18 145 lb 12 oz (66.1 kg)  ?  ?  ?Physical Exam ?Constitutional:   ?   General: She is not in acute distress. ?   Appearance: Normal appearance. She is well-developed. She is not ill-appearing or  toxic-appearing.  ?HENT:  ?   Head: Normocephalic.  ?   Right Ear: Hearing, tympanic membrane, ear canal and external ear normal. Tympanic membrane is not erythematous, retracted or bulging.  ?   Left Ear: Hearing, tympanic membrane, ear canal and external ear normal. Tympanic membrane is not erythematous, retracted or bulging.  ?   Nose: No mucosal edema or rhinorrhea.  ?   Right Sinus: No maxillary sinus tenderness or frontal sinus tenderness.  ?   Left Sinus: No maxillary sinus tenderness or frontal sinus tenderness.  ?   Mouth/Throat:  ?   Pharynx: Uvula midline. Posterior oropharyngeal erythema present. No pharyngeal swelling, oropharyngeal exudate or uvula swelling.  ?Eyes:  ?   General: Lids are normal. Lids are everted, no foreign bodies appreciated.  ?   Conjunctiva/sclera: Conjunctivae normal.  ?   Pupils: Pupils are equal, round, and reactive to  light.  ?Neck:  ?   Thyroid: No thyroid mass or thyromegaly.  ?   Vascular: No carotid bruit.  ?   Trachea: Trachea normal.  ?Cardiovascular:  ?   Rate and Rhythm: Normal rate and regular rhythm.  ?   Pulses: Normal pulses.  ?   Heart sounds: Normal heart sounds, S1 normal and S2 normal. No murmur heard. ?  No friction rub. No gallop.  ?Pulmonary:  ?   Effort: Pulmonary effort is normal. No tachypnea or respiratory distress.  ?   Breath sounds: Normal breath sounds. No decreased breath sounds, wheezing, rhonchi or rales.  ?Abdominal:  ?   General: Bowel sounds are normal.  ?   Palpations: Abdomen is soft.  ?   Tenderness: There is no abdominal tenderness.  ?Musculoskeletal:  ?   Cervical back: Normal range of motion and neck supple.  ?Skin: ?   General: Skin is warm and dry.  ?   Findings: No rash.  ?Neurological:  ?   Mental Status: She is alert.  ?Psychiatric:     ?   Mood and Affect: Mood is not anxious or depressed.     ?   Speech: Speech normal.     ?   Behavior: Behavior normal. Behavior is cooperative.     ?   Thought Content: Thought content normal.     ?   Judgment: Judgment normal.  ? ?   ?Results for orders placed or performed in visit on 10/30/21  ?POCT rapid strep A  ?Result Value Ref Range  ? Rapid Strep A Screen Negative Negative  ? ? ?This visit occurred during the SARS-CoV-2 public health emergency.  Safety protocols were in place, including screening questions prior to the visit, additional usage of staff PPE, and extensive cleaning of exam room while observing appropriate contact time as indicated for disinfecting solutions.  ? ?COVID 19 screen:  No recent travel or known exposure to COVID19 ?The patient denies respiratory symptoms of COVID 19 at this time. ?The importance of social distancing was discussed today.  ? ?Assessment and Plan ?Problem List Items Addressed This Visit   ?None ?Visit Diagnoses   ? ? Exposure to strep throat    -  Primary  ? Relevant Orders  ? POCT rapid strep A  (Completed)  ? Culture, Group A Strep  ? Oropharynx erythematous      ? Relevant Orders  ? Culture, Group A Strep  ? ?  ? ?Rapid strep test was negative but given multiple exposures in multiple family members, we will send throat culture.  If throat  culture comes back over the weekend she will fill the course of antibiotics to treat.  We will hold off empiric treatment unless her symptoms significantly worsen.  Azithromycin was chosen given her predisposition toGI and vaginal complaints with antibiotics. ? ?. ?Kerby Nora, MD  ? ?

## 2021-10-30 NOTE — Patient Instructions (Signed)
We will call with throat culture results. ? If sore throat starts or culture returns positive.Marland Kitchen go ahead and  take the antibiotics course. ?

## 2021-11-01 LAB — CULTURE, GROUP A STREP
MICRO NUMBER:: 13326585
SPECIMEN QUALITY:: ADEQUATE

## 2021-12-04 ENCOUNTER — Encounter: Payer: Self-pay | Admitting: Family

## 2021-12-04 ENCOUNTER — Ambulatory Visit: Payer: BC Managed Care – PPO | Admitting: Family

## 2021-12-04 VITALS — BP 92/60 | HR 75 | Temp 99.1°F | Resp 16 | Ht 63.5 in | Wt 134.4 lb

## 2021-12-04 DIAGNOSIS — L219 Seborrheic dermatitis, unspecified: Secondary | ICD-10-CM | POA: Diagnosis not present

## 2021-12-04 DIAGNOSIS — L299 Pruritus, unspecified: Secondary | ICD-10-CM | POA: Diagnosis not present

## 2021-12-04 MED ORDER — KETOCONAZOLE 2 % EX SHAM: MEDICATED_SHAMPOO | CUTANEOUS | 0 refills | Status: DC

## 2021-12-04 NOTE — Assessment & Plan Note (Signed)
Recommend nightly zyrtec If no improvement can consider cbc ldh cmp as no recent labs in some time

## 2021-12-04 NOTE — Progress Notes (Signed)
Established Patient Office Visit  Subjective:  Patient ID: Kim Woodward, female    DOB: 1966/02/07  Age: 56 y.o. MRN: [id number]  CC:  Chief Complaint  Patient presents with   Hair/Scalp Problem    Itching from top of the head to rest of the body except for arms and legs.    HPI Kim MatinCatherine E Shamblin is here today with concerns.   Has been having itching from head all the way down her back, sometimes on her chest, and has been itching for the last four days. She did state she was itching this am but right now is not.   No rash no bumps no dry skin she has noticed. Motioning and moisturizing daily with no relief.  Doesn't see any issues with scalp.  Does take her daughters flovent when she gets a cold. She did start taking it last week because she had a bit of a cold, and the virus stopped and cough improved. She is taking 44 mcg of the flovent. No new lotions, body wash, no new food or medications.   Tried benadryl made her sleepy but itching did not improve.   Past Medical History:  Diagnosis Date   Chickenpox    Hormone replacement therapy (HRT)    Kidney stones    Migraine    Seasonal allergies     Past Surgical History:  Procedure Laterality Date   OTHER SURGICAL HISTORY     Benign Growth Inside Cheek    uterine polyps  2002   WISDOM TOOTH EXTRACTION      Family History  Problem Relation Age of Onset   Thyroid nodules Mother    Uterine cancer Mother    Arthritis Maternal Grandmother    Colon cancer Maternal Grandfather    Hyperlipidemia Paternal Grandmother    Colon cancer Paternal Grandfather     Social History   Socioeconomic History   Marital status: Married    Spouse name: Not on file   Number of children: Not on file   Years of education: Not on file   Highest education level: Not on file  Occupational History   Not on file  Tobacco Use   Smoking status: Never   Smokeless tobacco: Never  Substance and Sexual Activity   Alcohol use:  Yes   Drug use: Not on file   Sexual activity: Not on file  Other Topics Concern   Not on file  Social History Narrative   Married.   1 child.   Works in Programme researcher, broadcasting/film/videoducation.   Enjoys photography, spending time with family, piano.   Social Determinants of Health   Financial Resource Strain: Not on file  Food Insecurity: Not on file  Transportation Needs: Not on file  Physical Activity: Not on file  Stress: Not on file  Social Connections: Not on file  Intimate Partner Violence: Not on file    Outpatient Medications Prior to Visit  Medication Sig Dispense Refill   Ascorbic Acid (VITAMIN C) 1000 MG tablet Take 1,000 mg by mouth daily.     cetirizine (ZYRTEC) 10 MG tablet Take 10 mg by mouth daily.     Cholecalciferol (VITAMIN D3) 50 MCG (2000 UT) TABS Take 1 tablet by mouth daily.     COLLAGEN PO Take 2 tablets by mouth daily.     COMBIPATCH 0.05-0.14 MG/DAY Place 1 patch onto the skin 2 (two) times a week.      fluticasone (FLONASE) 50 MCG/ACT nasal spray Place 1 spray into both  nostrils daily.     GLUCOSAMINE-CHONDROITIN PO Take 1 tablet by mouth daily.     Multiple Vitamin (MULTIVITAMIN) tablet Take 1 tablet by mouth daily.     azithromycin (ZITHROMAX) 250 MG tablet 2 tab po x 1 day then 1 tab po daily (Patient not taking: Reported on 12/04/2021) 6 tablet 0   No facility-administered medications prior to visit.    No Known Allergies      Objective:    Physical Exam Constitutional:      General: She is not in acute distress.    Appearance: Normal appearance. She is normal weight. She is not ill-appearing, toxic-appearing or diaphoretic.  HENT:     Right Ear: Tympanic membrane normal.     Left Ear: Tympanic membrane normal.     Nose: Nose normal. No congestion or rhinorrhea.     Right Turbinates: Not enlarged or swollen.     Left Turbinates: Not enlarged or swollen.     Right Sinus: No maxillary sinus tenderness or frontal sinus tenderness.     Left Sinus: No maxillary sinus  tenderness or frontal sinus tenderness.     Mouth/Throat:     Mouth: Mucous membranes are moist.     Pharynx: No pharyngeal swelling, oropharyngeal exudate or posterior oropharyngeal erythema.     Tonsils: No tonsillar exudate.  Eyes:     Extraocular Movements: Extraocular movements intact.     Conjunctiva/sclera: Conjunctivae normal.     Pupils: Pupils are equal, round, and reactive to light.  Neck:     Thyroid: No thyroid mass.  Cardiovascular:     Rate and Rhythm: Normal rate and regular rhythm.  Pulmonary:     Effort: Pulmonary effort is normal.     Breath sounds: Normal breath sounds.  Lymphadenopathy:     Cervical:     Right cervical: No superficial cervical adenopathy.    Left cervical: No superficial cervical adenopathy.  Skin:    Comments: Annual redness with central clearing in multiple occipital areas of lower scalp   Neurological:     General: No focal deficit present.     Mental Status: She is alert and oriented to person, place, and time. Mental status is at baseline.  Psychiatric:        Mood and Affect: Mood normal.        Behavior: Behavior normal.        Thought Content: Thought content normal.        Judgment: Judgment normal.    BP 92/60   Pulse 75   Temp 99.1 F (37.3 C)   Resp 16   Ht 5' 3.5" (1.613 m)   Wt 134 lb 6 oz (61 kg)   LMP 11/17/2011   SpO2 98%   BMI 23.43 kg/m  Wt Readings from Last 3 Encounters:  12/04/21 134 lb 6 oz (61 kg)  10/30/21 135 lb 4 oz (61.3 kg)  12/13/18 150 lb (68 kg)     Health Maintenance Due  Topic Date Due   COVID-19 Vaccine (1) Never done   HIV Screening  Never done   Hepatitis C Screening  Never done   COLONOSCOPY (Pts 45-77yrs Insurance coverage will need to be confirmed)  Never done   TETANUS/TDAP  09/15/2014   Zoster Vaccines- Shingrix (1 of 2) Never done   MAMMOGRAM  04/19/2021    There are no preventive care reminders to display for this patient.  No results found for: TSH Lab Results  Component  Value Date  WBC 6.8 09/08/2009   HGB 14.6 09/08/2009   HCT 43.6 09/08/2009   MCV 89.6 09/08/2009   PLT 218 09/08/2009   Lab Results  Component Value Date   NA 138 09/08/2009   K 3.7 09/08/2009   CO2 25 09/08/2009   GLUCOSE 105 (H) 09/08/2009   BUN 9 09/08/2009   CREATININE 0.81 09/08/2009   BILITOT 0.7 09/08/2009   ALKPHOS 57 09/08/2009   AST 21 09/08/2009   ALT 23 09/08/2009   PROT 7.3 09/08/2009   ALBUMIN 3.7 09/08/2009   CALCIUM 8.7 09/08/2009   No results found for: HGBA1C    Assessment & Plan:   Problem List Items Addressed This Visit       Musculoskeletal and Integument   Seborrheic dermatitis of scalp - Primary    Start rx nizoral shampoo Handout given to pt in regards to education        Relevant Medications   ketoconazole (NIZORAL) 2 % shampoo     Other   Pruritus    Recommend nightly zyrtec If no improvement can consider cbc ldh cmp as no recent labs in some time         Meds ordered this encounter  Medications   ketoconazole (NIZORAL) 2 % shampoo    Sig: Apply to scalp two times a week for eight weeks then prn    Dispense:  120 mL    Refill:  0    Order Specific Question:   Supervising Provider    Answer:   BEDSOLE, AMY E [[id number]]    Follow-up: Return for schedule cpe with kate clark, pcp .    Mort Sawyers, FNP

## 2021-12-04 NOTE — Assessment & Plan Note (Signed)
Start rx nizoral shampoo Handout given to pt in regards to education

## 2021-12-04 NOTE — Patient Instructions (Signed)
Recommend nightly zyrtec.     Due to recent changes in healthcare laws, you may see results of your imaging and/or laboratory studies on MyChart before I have had a chance to review them.  I understand that in some cases there may be results that are confusing or concerning to you. Please understand that not all results are received at the same time and often I may need to interpret multiple results in order to provide you with the best plan of care or course of treatment. Therefore, I ask that you please give me 2 business days to thoroughly review all your results before contacting my office for clarification. Should we see a critical lab result, you will be contacted sooner.   It was a pleasure seeing you today! Please do not hesitate to reach out with any questions and or concerns.  Regards,   Mort Sawyers FNP-C

## 2021-12-06 ENCOUNTER — Encounter: Payer: Self-pay | Admitting: Family

## 2021-12-06 DIAGNOSIS — L299 Pruritus, unspecified: Secondary | ICD-10-CM

## 2021-12-07 MED ORDER — HYDROXYZINE PAMOATE 25 MG PO CAPS: 25.0000 mg | ORAL_CAPSULE | Freq: Three times a day (TID) | ORAL | 0 refills | Status: AC | PRN

## 2021-12-07 MED ORDER — METHYLPREDNISOLONE 4 MG PO TBPK: ORAL_TABLET | ORAL | 0 refills | Status: DC

## 2021-12-07 NOTE — Telephone Encounter (Signed)
Patient is calling in regards to this, asking if she needs to schedule another appointment or not.

## 2021-12-07 NOTE — Telephone Encounter (Signed)
Sent message to provider

## 2021-12-11 ENCOUNTER — Other Ambulatory Visit (INDEPENDENT_AMBULATORY_CARE_PROVIDER_SITE_OTHER): Payer: BC Managed Care – PPO

## 2021-12-11 ENCOUNTER — Other Ambulatory Visit: Payer: Self-pay | Admitting: Family

## 2021-12-11 DIAGNOSIS — L299 Pruritus, unspecified: Secondary | ICD-10-CM | POA: Diagnosis not present

## 2021-12-11 DIAGNOSIS — T7840XA Allergy, unspecified, initial encounter: Secondary | ICD-10-CM | POA: Diagnosis not present

## 2021-12-11 LAB — CBC WITH DIFFERENTIAL/PLATELET
Basophils Absolute: 0.1 10*3/uL (ref 0.0–0.1)
Basophils Relative: 0.6 % (ref 0.0–3.0)
Eosinophils Absolute: 0.1 10*3/uL (ref 0.0–0.7)
Eosinophils Relative: 0.8 % (ref 0.0–5.0)
HCT: 42.8 % (ref 36.0–46.0)
Hemoglobin: 14.6 g/dL (ref 12.0–15.0)
Lymphocytes Relative: 29.3 % (ref 12.0–46.0)
Lymphs Abs: 3.6 10*3/uL (ref 0.7–4.0)
MCHC: 34.2 g/dL (ref 30.0–36.0)
MCV: 88.1 fl (ref 78.0–100.0)
Monocytes Absolute: 0.6 10*3/uL (ref 0.1–1.0)
Monocytes Relative: 4.9 % (ref 3.0–12.0)
Neutro Abs: 8 10*3/uL — ABNORMAL HIGH (ref 1.4–7.7)
Neutrophils Relative %: 64.4 % (ref 43.0–77.0)
Platelets: 223 10*3/uL (ref 150.0–400.0)
RBC: 4.85 Mil/uL (ref 3.87–5.11)
RDW: 13.6 % (ref 11.5–15.5)
WBC: 12.4 10*3/uL — ABNORMAL HIGH (ref 4.0–10.5)

## 2021-12-11 NOTE — Addendum Note (Signed)
Addended by: Mort Sawyers on: 12/11/2021 08:58 AM   Modules accepted: Orders

## 2021-12-14 NOTE — Progress Notes (Signed)
Allergies positive for D farinae which is a house dust mite, allergen A alternata which is a fungi, as well as three different types of grass.   White blood cells are elevated as well. Neutrophils slightly elevated, not overly concerns.  Ldh ok.   Read her note, see if pt can come in tomorrow if I have an opening on my schedule to look at this new rash.

## 2021-12-15 ENCOUNTER — Other Ambulatory Visit: Payer: Self-pay | Admitting: Family

## 2021-12-15 ENCOUNTER — Telehealth: Payer: Self-pay | Admitting: Primary Care

## 2021-12-15 ENCOUNTER — Telehealth: Payer: BC Managed Care – PPO | Admitting: Family Medicine

## 2021-12-15 DIAGNOSIS — Z207 Contact with and (suspected) exposure to pediculosis, acariasis and other infestations: Secondary | ICD-10-CM

## 2021-12-15 LAB — RESPIRATORY ALLERGY PROFILE REGION II ~~LOC~~
Allergen, A. alternata, m6: 0.1 kU/L — ABNORMAL HIGH
Allergen, Cedar tree, t12: <0.1 kU/L
Allergen, Comm Silver Birch, t9: <0.1 kU/L
Allergen, Cottonwood, t14: <0.1 kU/L
Allergen, D pternoyssinus,d7: <0.1 kU/L
Allergen, Mouse Urine Protein, e78: <0.1 kU/L
Allergen, Mulberry, t76: <0.1 kU/L
Allergen, Oak,t7: <0.1 kU/L
Allergen, P. notatum, m1: <0.1 kU/L
Aspergillus fumigatus, m3: <0.1 kU/L
Bermuda Grass: 0.22 kU/L — ABNORMAL HIGH
Box Elder IgE: <0.1 kU/L
CLADOSPORIUM HERBARUM (M2) IGE: <0.1 kU/L
COMMON RAGWEED (SHORT) (W1) IGE: <0.1 kU/L
Cat Dander: <0.1 kU/L
Class: 0
Class: 0
Class: 0
Class: 0
Class: 0
Class: 0
Class: 0
Class: 0
Class: 0
Class: 0
Class: 0
Class: 0
Class: 0
Class: 0
Class: 0
Class: 0
Class: 0
Class: 0
Class: 0
Class: 2
Cockroach: <0.1 kU/L
D. farinae: 0.14 kU/L — ABNORMAL HIGH
Dog Dander: <0.1 kU/L
Elm IgE: <0.1 kU/L
IgE (Immunoglobulin E), Serum: 14 kU/L (ref <=114)
Johnson Grass: 0.17 kU/L — ABNORMAL HIGH
Pecan/Hickory Tree IgE: <0.1 kU/L
Rough Pigweed  IgE: <0.1 kU/L
Sheep Sorrel IgE: <0.1 kU/L
Timothy Grass: 2.28 kU/L — ABNORMAL HIGH

## 2021-12-15 LAB — FOOD ALLERGY PROFILE W/ REFLEXES
Allergen, Salmon, f41: <0.1 kU/L
Almonds: <0.1 kU/L
CLASS: 0
CLASS: 0
CLASS: 0
CLASS: 0
CLASS: 0
CLASS: 0
CLASS: 0
CLASS: 0
CLASS: 0
CLASS: 0
CLASS: 0
Cashew IgE: <0.1 kU/L
Class: 0
Class: 0
Class: 0
Class: 0
Egg White IgE: <0.1 kU/L
Fish Cod: <0.1 kU/L
Hazelnut: <0.1 kU/L
Milk IgE: <0.1 kU/L
Peanut IgE: <0.1 kU/L
Scallop IgE: <0.1 kU/L
Sesame Seed f10: <0.1 kU/L
Shrimp IgE: <0.1 kU/L
Soybean IgE: <0.1 kU/L
Tuna IgE: <0.1 kU/L
Walnut: <0.1 kU/L
Wheat IgE: <0.1 kU/L

## 2021-12-15 LAB — ALPHA-GAL PANEL
Allergen, Mutton, f88: <0.1 kU/L
Allergen, Pork, f26: <0.1 kU/L
Beef: <0.1 kU/L
CLASS: 0
CLASS: 0
Class: 0
GALACTOSE-ALPHA-1,3-GALACTOSE IGE*: <0.1 kU/L (ref <0.10)

## 2021-12-15 LAB — LACTATE DEHYDROGENASE: LDH: 167 U/L (ref 120–250)

## 2021-12-15 LAB — INTERPRETATION:

## 2021-12-15 MED ORDER — PERMETHRIN 5 % EX CREA: TOPICAL_CREAM | CUTANEOUS | 0 refills | Status: DC

## 2021-12-15 NOTE — Telephone Encounter (Signed)
Patient states that she was seen for rash and that is has continued to get worse.She said that she thinks that it might be scabies.She stated that her husband said it looks like there might be burrowing trails on her back. She wants to know if something can be called in. Patient uses Cytogeneticist.

## 2021-12-15 NOTE — Telephone Encounter (Signed)
Called patient husband put on for virtual visit today

## 2021-12-25 ENCOUNTER — Ambulatory Visit: Payer: BC Managed Care – PPO | Admitting: Family

## 2022-06-16 LAB — HM MAMMOGRAPHY

## 2022-09-10 ENCOUNTER — Encounter: Payer: Self-pay | Admitting: Family Medicine

## 2022-09-10 ENCOUNTER — Ambulatory Visit: Payer: BC Managed Care – PPO | Admitting: Family Medicine

## 2022-09-10 VITALS — BP 90/60 | HR 83 | Temp 98.4°F | Ht 63.5 in | Wt 137.5 lb

## 2022-09-10 DIAGNOSIS — J45909 Unspecified asthma, uncomplicated: Secondary | ICD-10-CM | POA: Insufficient documentation

## 2022-09-10 DIAGNOSIS — J4520 Mild intermittent asthma, uncomplicated: Secondary | ICD-10-CM | POA: Diagnosis not present

## 2022-09-10 DIAGNOSIS — H6993 Unspecified Eustachian tube disorder, bilateral: Secondary | ICD-10-CM | POA: Diagnosis not present

## 2022-09-10 MED ORDER — BUDESONIDE 90 MCG/ACT IN AEPB: 1.0000 | INHALATION_SPRAY | Freq: Two times a day (BID) | RESPIRATORY_TRACT | 1 refills | Status: AC

## 2022-09-10 NOTE — Patient Instructions (Signed)
Increase Flonase to 2 spray per nostril daily.  Continue Mucinex.  Add nasal saline irrigation.  Call if not improving as expected for possible steroid course.  Call sooner if new fever or unilateral face [pain.. for possible bacterial infection.  In future you can use the Pulmicort inhaler with wheezing illness.

## 2022-09-10 NOTE — Progress Notes (Signed)
Patient ID: Kim Woodward, female    DOB: Sep 18, 1965, 57 y.o.   MRN: [id number]  This visit was conducted in person.  BP 90/60   Pulse 83   Temp 98.4 F (36.9 C) (Temporal)   Ht 5' 3.5" (1.613 m)   Wt 137 lb 8 oz (62.4 kg)   LMP 11/17/2011   SpO2 98%   BMI 23.97 kg/m    CC:  Chief Complaint  Patient presents with   Nasal Congestion    Had a cold 2 weeks ago but can't get rid of nasal congestion   Ear Pressure    Subjective:   HPI: Kim Woodward is a 57 y.o. female presenting on 09/10/2022 for Nasal Congestion (Had a cold 2 weeks ago but can't get rid of nasal congestion) and Ear Pressure   Date of onset: 2 weeks Initial symptoms included runny nose cough and congestion Symptoms progressed to persistent nasal congestion, bilateral ear pressure   She feels well otherwise.  No ST, no face pain.  No fever. No ear pain.  No SOB, no wheezing  Sick contacts:  none COVID testing:   none     She has tried to treat with  Flovent  for 1 week ( child's prescription)   Does not like how albuterol has effected her in the past.  Mucinex.   No history of chronic lung disease such as asthma or COPD , but seems to have reactive airway.  She is using Xyzal and flonase 1 sprays per nostril daily  Non-smoker.      Relevant past medical, surgical, family and social history reviewed and updated as indicated. Interim medical history since our last visit reviewed. Allergies and medications reviewed and updated. Outpatient Medications Prior to Visit  Medication Sig Dispense Refill   Ascorbic Acid (VITAMIN C) 1000 MG tablet Take 1,000 mg by mouth daily.     Cholecalciferol (VITAMIN D3) 50 MCG (2000 UT) TABS Take 1 tablet by mouth daily.     COLLAGEN PO Take 2 tablets by mouth daily.     COMBIPATCH 0.05-0.14 MG/DAY Place 1 patch onto the skin 2 (two) times a week.      fluticasone (FLONASE) 50 MCG/ACT nasal spray Place 1 spray into both nostrils daily.      GLUCOSAMINE-CHONDROITIN PO Take 1 tablet by mouth daily.     Guaifenesin (MUCINEX MAXIMUM STRENGTH) 1200 MG TB12 Take 1 tablet by mouth in the morning and at bedtime.     levocetirizine (XYZAL) 5 MG tablet Take 5 mg by mouth every evening.     Multiple Vitamin (MULTIVITAMIN) tablet Take 1 tablet by mouth daily.     cetirizine (ZYRTEC) 10 MG tablet Take 10 mg by mouth daily.     ketoconazole (NIZORAL) 2 % shampoo Apply to scalp two times a week for eight weeks then prn 120 mL 0   methylPREDNISolone (MEDROL DOSEPAK) 4 MG TBPK tablet Take per package instructions 21 tablet 0   permethrin (ELIMITE) 5 % cream Apply to entire skin from chin down to and including toes under fingernails and toenails. Leave on 8-14 hours. Repeat in 1-2 weeks. Reapply to hands after washing. 30 g 0   No facility-administered medications prior to visit.     Per HPI unless specifically indicated in ROS section below Review of Systems  Constitutional:  Negative for fatigue and fever.  HENT:  Positive for congestion.   Eyes:  Negative for pain.  Respiratory:  Negative for cough and  shortness of breath.   Cardiovascular:  Negative for chest pain, palpitations and leg swelling.  Gastrointestinal:  Negative for abdominal pain.  Genitourinary:  Negative for dysuria and vaginal bleeding.  Musculoskeletal:  Negative for back pain.  Neurological:  Negative for syncope, light-headedness and headaches.  Psychiatric/Behavioral:  Negative for dysphoric mood.    Objective:  BP 90/60   Pulse 83   Temp 98.4 F (36.9 C) (Temporal)   Ht 5' 3.5" (1.613 m)   Wt 137 lb 8 oz (62.4 kg)   LMP 11/17/2011   SpO2 98%   BMI 23.97 kg/m   Wt Readings from Last 3 Encounters:  09/10/22 137 lb 8 oz (62.4 kg)  12/04/21 134 lb 6 oz (61 kg)  10/30/21 135 lb 4 oz (61.3 kg)      Physical Exam Constitutional:      General: She is not in acute distress.    Appearance: Normal appearance. She is well-developed. She is not ill-appearing or  toxic-appearing.  HENT:     Head: Normocephalic.     Right Ear: Hearing, ear canal and external ear normal. A middle ear effusion is present. Tympanic membrane is not erythematous, retracted or bulging.     Left Ear: Hearing, ear canal and external ear normal. A middle ear effusion is present. Tympanic membrane is not erythematous, retracted or bulging.     Nose: Mucosal edema and congestion present. No rhinorrhea.     Right Sinus: No maxillary sinus tenderness or frontal sinus tenderness.     Left Sinus: No maxillary sinus tenderness or frontal sinus tenderness.     Mouth/Throat:     Pharynx: Uvula midline.  Eyes:     General: Lids are normal. Lids are everted, no foreign bodies appreciated.     Conjunctiva/sclera: Conjunctivae normal.     Pupils: Pupils are equal, round, and reactive to light.  Neck:     Thyroid: No thyroid mass or thyromegaly.     Vascular: No carotid bruit.     Trachea: Trachea normal.  Cardiovascular:     Rate and Rhythm: Normal rate and regular rhythm.     Pulses: Normal pulses.     Heart sounds: Normal heart sounds, S1 normal and S2 normal. No murmur heard.    No friction rub. No gallop.  Pulmonary:     Effort: Pulmonary effort is normal. No tachypnea or respiratory distress.     Breath sounds: Normal breath sounds. No decreased breath sounds, wheezing, rhonchi or rales.  Abdominal:     General: Bowel sounds are normal.     Palpations: Abdomen is soft.     Tenderness: There is no abdominal tenderness.  Musculoskeletal:     Cervical back: Normal range of motion and neck supple.  Skin:    General: Skin is warm and dry.     Findings: No rash.  Neurological:     Mental Status: She is alert.  Psychiatric:        Mood and Affect: Mood is not anxious or depressed.        Speech: Speech normal.        Behavior: Behavior normal. Behavior is cooperative.        Thought Content: Thought content normal.        Judgment: Judgment normal.       Results for  orders placed or performed in visit on 12/11/21  Resp Allergy Profile Regn2DC [place] [facility]  Result Value Ref Range   Allergen, D pternoyssinus,d7 <0.10  kU/L   Class 0    D. farinae 0.14 (H) kU/L   Class 0/1    Allergen, P. notatum, m1 <0.10 kU/L   Class 0    CLADOSPORIUM HERBARUM (M2) IGE <0.10 kU/L   Class 0    Aspergillus fumigatus, m3 <0.10 kU/L   Class 0    Allergen, A. alternata, m6 0.10 (H) kU/L   Class 0/1    Cat Dander <0.10 kU/L   Class 0    Dog Dander <0.10 kU/L   Class 0    Cockroach <0.10 kU/L   Class 0    Box Elder IgE <0.10 kU/L   Class 0    Allergen, Comm Silver Wendee Copp, t9 <0.10 kU/L   Class 0    Allergen, Cedar tree, t12 <0.10 kU/L   Class 0    Allergen, Cottonwood, t14 <0.10 kU/L   Class 0    Allergen, Oak,t7 <0.10 kU/L   Class 0    Elm IgE <0.10 kU/L   Class 0    Pecan/Hickory Tree IgE <0.10 kU/L   Class 0    Allergen, Mulberry, t76 <0.10 kU/L   Class 0    [place] Grass 0.22 (H) kU/L   Class 0/1    Timothy Grass 2.28 (H) kU/L   Class 2    Johnson Grass 0.17 (H) kU/L   Class 0/1    COMMON RAGWEED (SHORT) (W1) IGE <0.10 kU/L   Class 0    Rough Pigweed  IgE <0.10 kU/L   Class 0    Sheep Sorrel IgE <0.10 kU/L   Class 0    Allergen, Mouse Urine Protein, e78 <0.10 kU/L   Class 0    IgE (Immunoglobulin E), Serum 14 <OR=114 kU/L  Lactate dehydrogenase  Result Value Ref Range   LDH 167 120 - 250 U/L  Food Allergy Profile w/ Reflexes  Result Value Ref Range   Egg White IgE <0.10 kU/L   Class 0    Peanut IgE <0.10 kU/L   Class 0    Wheat IgE <0.10 kU/L   CLASS 0    Walnut <0.10 kU/L   CLASS 0    Fish Cod <0.10 kU/L   CLASS 0    Milk IgE <0.10 kU/L   Class 0    Soybean IgE <0.10 kU/L   CLASS 0    Shrimp IgE <0.10 kU/L   Class 0    Scallop IgE <0.10 kU/L   CLASS 0    Sesame Seed f10  <0.10 kU/L   CLASS 0    Hazelnut <0.10 kU/L   CLASS 0    Cashew IgE <0.10 kU/L   CLASS 0    Almonds <0.10 kU/L   CLASS 0    Allergen, Salmon, f41  <0.10 kU/L   CLASS 0    Tuna IgE <0.10 kU/L   CLASS 0   CBC with Differential  Result Value Ref Range   WBC 12.4 (H) 4.0 - 10.5 K/uL   RBC 4.85 3.87 - 5.11 Mil/uL   Hemoglobin 14.6 12.0 - 15.0 g/dL   HCT 42.8 36.0 - 46.0 %   MCV 88.1 78.0 - 100.0 fl   MCHC 34.2 30.0 - 36.0 g/dL   RDW 13.6 11.5 - 15.5 %   Platelets 223.0 Repeated and verified X2. 150.0 - 400.0 K/uL   Neutrophils Relative % 64.4 43.0 - 77.0 %   Lymphocytes Relative 29.3 12.0 - 46.0 %   Monocytes Relative 4.9 3.0 - 12.0 %   Eosinophils  Relative 0.8 0.0 - 5.0 %   Basophils Relative 0.6 0.0 - 3.0 %   Neutro Abs 8.0 (H) 1.4 - 7.7 K/uL   Lymphs Abs 3.6 0.7 - 4.0 K/uL   Monocytes Absolute 0.6 0.1 - 1.0 K/uL   Eosinophils Absolute 0.1 0.0 - 0.7 K/uL   Basophils Absolute 0.1 0.0 - 0.1 K/uL  Alpha-Gal Panel  Result Value Ref Range   Beef <0.10 kU/L   CLASS 0    Allergen, Mutton, f88 <0.10 kU/L   Class 0    Allergen, Pork, f26 <0.10 kU/L   CLASS 0    GALACTOSE-ALPHA-1,3-GALACTOSE IGE* <0.10 <0.10 kU/L  Interpretation:  Result Value Ref Range   Interpretation      Assessment and Plan  Mild intermittent reactive airway disease without complication Assessment & Plan: Chronic intermittent She seems to have reactive airways secondary to viral infection.  She has had significant benefit progression she was using was not covered by her insurance so I will send in a prescription for Pulmicort low-dose for her to use 1 puff twice daily as needed with wheezing illness.   ETD (Eustachian tube dysfunction), bilateral Assessment & Plan: Acute, postinfectious symptoms.  No current sign of infection viral or bacterial.  Possible allergy reaction and she will continue her current allergy medications.  Increase Flonase to 2 sprays per nostril daily.  If not improving as expected will treat with prednisone taper.  Return and ER precautions provided.   Other orders -     Budesonide; Inhale 1 puff into the lungs 2 (two) times  daily.  Dispense: 1 each; Refill: 1    No follow-ups on file.   Eliezer Lofts, MD

## 2022-09-10 NOTE — Assessment & Plan Note (Signed)
Acute, postinfectious symptoms.  No current sign of infection viral or bacterial.  Possible allergy reaction and she will continue her current allergy medications.  Increase Flonase to 2 sprays per nostril daily.  If not improving as expected will treat with prednisone taper.  Return and ER precautions provided.

## 2022-09-10 NOTE — Assessment & Plan Note (Signed)
Chronic intermittent She seems to have reactive airways secondary to viral infection.  She has had significant benefit progression she was using was not covered by her insurance so I will send in a prescription for Pulmicort low-dose for her to use 1 puff twice daily as needed with wheezing illness.

## 2022-10-11 ENCOUNTER — Other Ambulatory Visit: Payer: Self-pay | Admitting: Family Medicine

## 2022-10-11 NOTE — Telephone Encounter (Signed)
Received refill request for Pulmicort inhaler. I haven't seen patient since June 2020. I'm happy to keep her as a patient but she needs follow up with me for further refills. Please schedule and let me know once scheduled, thanks!

## 2022-10-12 NOTE — Telephone Encounter (Signed)
Lvm for patient tcb and schedule 

## 2022-10-13 NOTE — Telephone Encounter (Signed)
LVM for pt to cb and sch.  

## 2022-10-19 ENCOUNTER — Telehealth: Payer: Self-pay | Admitting: Primary Care

## 2022-10-19 NOTE — Telephone Encounter (Signed)
Noted, will decline refill request until patient schedules an appointment.

## 2022-10-19 NOTE — Telephone Encounter (Signed)
Lvmtcb, sent mychart message  

## 2022-12-02 NOTE — Telephone Encounter (Signed)
Error

## 2023-03-02 ENCOUNTER — Encounter: Payer: Self-pay | Admitting: Family Medicine

## 2023-03-02 ENCOUNTER — Encounter: Payer: Self-pay | Admitting: *Deleted

## 2023-03-02 ENCOUNTER — Ambulatory Visit: Payer: BC Managed Care – PPO | Admitting: Family Medicine

## 2023-03-02 VITALS — BP 118/70 | HR 90 | Temp 97.7°F | Ht 63.5 in | Wt 134.0 lb

## 2023-03-02 DIAGNOSIS — L299 Pruritus, unspecified: Secondary | ICD-10-CM | POA: Diagnosis not present

## 2023-03-02 DIAGNOSIS — Z8619 Personal history of other infectious and parasitic diseases: Secondary | ICD-10-CM | POA: Diagnosis not present

## 2023-03-02 MED ORDER — PERMETHRIN 5 % EX CREA: TOPICAL_CREAM | CUTANEOUS | 0 refills | Status: DC

## 2023-03-02 NOTE — Assessment & Plan Note (Signed)
Acute, recurrent Patient feels strongly that she may have come in contact with scabies and would like to repeat a course of permethrin.  I have doubts of the diagnosis given no rash on scalp and given recurrent nature of symptoms.  We will move forward with referral to dermatology for further recommendations. In the meantime if her symptoms do not improve, she will recontact me and we can consider steroid shampoo.

## 2023-03-02 NOTE — Progress Notes (Signed)
Patient ID: Kim Woodward, female    DOB: 04/30/1966, 57 y.o.   MRN: [id number]  This visit was conducted in person.  BP 118/70 (BP Location: Left Arm, Patient Position: Sitting, Cuff Size: Normal)   Pulse 90   Temp 97.7 F (36.5 C) (Temporal)   Ht 5' 3.5" (1.613 m)   Wt 134 lb (60.8 kg)   LMP 11/17/2011   SpO2 92%   BMI 23.36 kg/m    CC:  Chief Complaint  Patient presents with   Sarcoptes Scabiei    Patient thinks she may have another scabies infection in her scalp. Patient has been very itchy x 1 month or so off and on.     Subjective:   HPI: Kim Woodward is a 57 y.o. female presenting on 03/02/2023 for Sarcoptes Scabiei (Patient thinks she may have another scabies infection in her scalp. Patient has been very itchy x 1 month or so off and on. )  Treated in the summer 2023 for pruritus and scabies exposure, likely scabies infection.  Symptoms resolved entirely after permethrin.  Today she reports in the last month she is scented to scalp burning and itching off and on, posterior scalp. No skin changes otherwise. No fever, no flaky skin on scalp.  No rash noted on scalp.  Treatment with ketoconazole shampoo did not help.  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  She does not know of any specific exposure to scabies.      Relevant past medical, surgical, family and social history reviewed and updated as indicated. Interim medical history since our last visit reviewed. Allergies and medications reviewed and updated. Outpatient Medications Prior to Visit  Medication Sig Dispense Refill   Ascorbic Acid (VITAMIN C) 1000 MG tablet Take 1,000 mg by mouth daily.     Budesonide 90 MCG/ACT inhaler Inhale 1 puff into the lungs 2 (two) times daily. 1 each 1   Cholecalciferol (VITAMIN D3) 50 MCG (2000 UT) TABS Take 1 tablet by mouth daily.     COLLAGEN PO Take 2 tablets by mouth daily.     COMBIPATCH 0.05-0.14 MG/DAY Place 1 patch onto  the skin 2 (two) times a week.      fluticasone (FLONASE) 50 MCG/ACT nasal spray Place 1 spray into both nostrils daily.     levocetirizine (XYZAL) 5 MG tablet Take 5 mg by mouth every evening.     Multiple Vitamin (MULTIVITAMIN) tablet Take 1 tablet by mouth daily.     GLUCOSAMINE-CHONDROITIN PO Take 1 tablet by mouth daily.     Guaifenesin (MUCINEX MAXIMUM STRENGTH) 1200 MG TB12 Take 1 tablet by mouth in the morning and at bedtime.     No facility-administered medications prior to visit.     Per HPI unless specifically indicated in ROS section below Review of Systems  Constitutional:  Negative for fatigue and fever.  HENT:  Negative for congestion.   Eyes:  Negative for pain.  Respiratory:  Negative for cough and shortness of breath.   Cardiovascular:  Negative for chest pain, palpitations and leg swelling.  Gastrointestinal:  Negative for abdominal pain.  Genitourinary:  Negative for dysuria and vaginal bleeding.  Musculoskeletal:  Negative for back pain.  Neurological:  Negative for syncope, light-headedness and headaches.  Psychiatric/Behavioral:  Negative for dysphoric mood.    Objective:  BP 118/70 (BP Location: Left Arm, Patient Position: Sitting, Cuff Size: Normal)   Pulse 90   Temp 97.7 F (36.5 C) (Temporal)  Ht 5' 3.5" (1.613 m)   Wt 134 lb (60.8 kg)   LMP 11/17/2011   SpO2 92%   BMI 23.36 kg/m   Wt Readings from Last 3 Encounters:  03/02/23 134 lb (60.8 kg)  09/10/22 137 lb 8 oz (62.4 kg)  12/04/21 134 lb 6 oz (61 kg)      Physical Exam Constitutional:      General: She is not in acute distress.    Appearance: Normal appearance. She is well-developed. She is not ill-appearing or toxic-appearing.  HENT:     Head: Normocephalic.     Right Ear: Hearing, tympanic membrane, ear canal and external ear normal. Tympanic membrane is not erythematous, retracted or bulging.     Left Ear: Hearing, tympanic membrane, ear canal and external ear normal. Tympanic membrane  is not erythematous, retracted or bulging.     Nose: No mucosal edema or rhinorrhea.     Right Sinus: No maxillary sinus tenderness or frontal sinus tenderness.     Left Sinus: No maxillary sinus tenderness or frontal sinus tenderness.     Mouth/Throat:     Mouth: Oropharynx is clear and moist and mucous membranes are normal.     Pharynx: Uvula midline.  Eyes:     General: Lids are normal. Lids are everted, no foreign bodies appreciated.     Extraocular Movements: EOM normal.     Conjunctiva/sclera: Conjunctivae normal.     Pupils: Pupils are equal, round, and reactive to light.  Neck:     Thyroid: No thyroid mass or thyromegaly.     Vascular: No carotid bruit.     Trachea: Trachea normal.  Cardiovascular:     Rate and Rhythm: Normal rate and regular rhythm.     Pulses: Normal pulses.     Heart sounds: Normal heart sounds, S1 normal and S2 normal. No murmur heard.    No friction rub. No gallop.  Pulmonary:     Effort: Pulmonary effort is normal. No tachypnea or respiratory distress.     Breath sounds: Normal breath sounds. No decreased breath sounds, wheezing, rhonchi or rales.  Abdominal:     General: Bowel sounds are normal.     Palpations: Abdomen is soft.     Tenderness: There is no abdominal tenderness.  Musculoskeletal:     Cervical back: Normal range of motion and neck supple.  Skin:    General: Skin is warm, dry and intact.     Findings: No rash.  Neurological:     Mental Status: She is alert.  Psychiatric:        Mood and Affect: Mood is not anxious or depressed.        Speech: Speech normal.        Behavior: Behavior normal. Behavior is cooperative.        Thought Content: Thought content normal.        Cognition and Memory: Cognition and memory normal.        Judgment: Judgment normal.       Results for orders placed or performed in visit on 12/11/21  Resp Allergy Profile Regn2DC [place] MD [facility]  Result Value Ref Range   Allergen, D pternoyssinus,d7 <0.10 kU/L    Class 0    D. farinae 0.14 (H) kU/L   Class 0/1    Allergen, P. notatum, m1 <0.10 kU/L   Class 0    CLADOSPORIUM HERBARUM (M2) IGE <0.10 kU/L   Class 0    Aspergillus fumigatus, m3 <0.10 kU/L  Class 0    Allergen, A. alternata, m6 0.10 (H) kU/L   Class 0/1    Cat Dander <0.10 kU/L   Class 0    Dog Dander <0.10 kU/L   Class 0    Cockroach <0.10 kU/L   Class 0    Box Elder IgE <0.10 kU/L   Class 0    Allergen, Comm Silver Charletta Cousin, t9 <0.10 kU/L   Class 0    Allergen, Cedar tree, t12 <0.10 kU/L   Class 0    Allergen, Cottonwood, t14 <0.10 kU/L   Class 0    Allergen, Oak,t7 <0.10 kU/L   Class 0    Elm IgE <0.10 kU/L   Class 0    Pecan/Hickory Tree IgE <0.10 kU/L   Class 0    Allergen, Mulberry, t76 <0.10 kU/L   Class 0    [place] Grass 0.22 (H) kU/L   Class 0/1    Timothy Grass 2.28 (H) kU/L   Class 2    [place] Grass 0.17 (H) kU/L   Class 0/1    COMMON RAGWEED (SHORT) (W1) IGE <0.10 kU/L   Class 0    Rough Pigweed  IgE <0.10 kU/L   Class 0    Sheep Sorrel IgE <0.10 kU/L   Class 0    Allergen, Mouse Urine Protein, e78 <0.10 kU/L   Class 0    IgE (Immunoglobulin E), Serum 14 <OR=114 kU/L  Lactate dehydrogenase  Result Value Ref Range   LDH 167 120 - 250 U/L  Food Allergy Profile w/ Reflexes  Result Value Ref Range   Egg White IgE <0.10 kU/L   Class 0    Peanut IgE <0.10 kU/L   Class 0    Wheat IgE <0.10 kU/L   CLASS 0    Walnut <0.10 kU/L   CLASS 0    Fish Cod <0.10 kU/L   CLASS 0    Milk IgE <0.10 kU/L   Class 0    Soybean IgE <0.10 kU/L   CLASS 0    Shrimp IgE <0.10 kU/L   Class 0    Scallop IgE <0.10 kU/L   CLASS 0    Sesame Seed f10  <0.10 kU/L   CLASS 0    Hazelnut <0.10 kU/L   CLASS 0    Cashew IgE <0.10 kU/L   CLASS 0    Almonds <0.10 kU/L   CLASS 0    Allergen, Salmon, f41 <0.10 kU/L   CLASS 0    Tuna IgE <0.10 kU/L   CLASS 0   CBC with Differential  Result Value Ref Range   WBC 12.4 (H) 4.0 - 10.5 K/uL   RBC 4.85 3.87 - 5.11  Mil/uL   Hemoglobin 14.6 12.0 - 15.0 g/dL   HCT 16.1 09.6 - 04.5 %   MCV 88.1 78.0 - 100.0 fl   MCHC 34.2 30.0 - 36.0 g/dL   RDW 40.9 81.1 - 91.4 %   Platelets 223.0 Repeated and verified X2. 150.0 - 400.0 K/uL   Neutrophils Relative % 64.4 43.0 - 77.0 %   Lymphocytes Relative 29.3 12.0 - 46.0 %   Monocytes Relative 4.9 3.0 - 12.0 %   Eosinophils Relative 0.8 0.0 - 5.0 %   Basophils Relative 0.6 0.0 - 3.0 %   Neutro Abs 8.0 (H) 1.4 - 7.7 K/uL   Lymphs Abs 3.6 0.7 - 4.0 K/uL   Monocytes Absolute 0.6 0.1 - 1.0 K/uL   Eosinophils Absolute 0.1 0.0 - 0.7 K/uL  Basophils Absolute 0.1 0.0 - 0.1 K/uL  Alpha-Gal Panel  Result Value Ref Range   Beef <0.10 kU/L   CLASS 0    Allergen, Mutton, f88 <0.10 kU/L   Class 0    Allergen, Pork, f26 <0.10 kU/L   CLASS 0    GALACTOSE-ALPHA-1,3-GALACTOSE IGE* <0.10 <0.10 kU/L  Interpretation:  Result Value Ref Range   Interpretation      Assessment and Plan  Pruritus Assessment & Plan: Acute, recurrent Patient feels strongly that she may have come in contact with scabies and would like to repeat a course of permethrin.  I have doubts of the diagnosis given no rash on scalp and given recurrent nature of symptoms.  We will move forward with referral to dermatology for further recommendations. In the meantime if her symptoms do not improve, she will recontact me and we can consider steroid shampoo.  Orders: -     Ambulatory referral to Dermatology  History of scabies  Other orders -     Permethrin; Apply to entire skin from chin down to and including toes under fingernails and toenails. Leave on 8-14 hours. Repeat in 1-2 weeks. Reapply to hands after washing.  Dispense: 60 g; Refill: 0    No follow-ups on file.   Kerby Nora, MD

## 2023-03-11 ENCOUNTER — Telehealth: Payer: Self-pay | Admitting: Primary Care

## 2023-03-11 NOTE — Telephone Encounter (Signed)
Fine with me, thanks.

## 2023-03-11 NOTE — Telephone Encounter (Signed)
Patient called in and stated that she wanted to set up a TOC with Dr. Ermalene Searing and that she said it was ok. Just wanted to check with both providers before scheduling the appointment. Thank you!

## 2023-03-11 NOTE — Telephone Encounter (Signed)
I discussed this with the patient and I am okay with transfer.

## 2023-03-11 NOTE — Telephone Encounter (Signed)
Scheduled

## 2023-04-06 ENCOUNTER — Encounter: Payer: Self-pay | Admitting: Family Medicine

## 2023-04-06 ENCOUNTER — Ambulatory Visit: Payer: BC Managed Care – PPO | Admitting: Family Medicine

## 2023-04-06 VITALS — BP 120/82 | HR 70 | Temp 98.4°F | Ht 63.5 in | Wt 134.0 lb

## 2023-04-06 DIAGNOSIS — Z8049 Family history of malignant neoplasm of other genital organs: Secondary | ICD-10-CM

## 2023-04-06 DIAGNOSIS — S73192A Other sprain of left hip, initial encounter: Secondary | ICD-10-CM | POA: Insufficient documentation

## 2023-04-06 DIAGNOSIS — J4520 Mild intermittent asthma, uncomplicated: Secondary | ICD-10-CM

## 2023-04-06 DIAGNOSIS — M542 Cervicalgia: Secondary | ICD-10-CM

## 2023-04-06 DIAGNOSIS — R200 Anesthesia of skin: Secondary | ICD-10-CM | POA: Diagnosis not present

## 2023-04-06 DIAGNOSIS — G8929 Other chronic pain: Secondary | ICD-10-CM | POA: Diagnosis not present

## 2023-04-06 DIAGNOSIS — S73192D Other sprain of left hip, subsequent encounter: Secondary | ICD-10-CM

## 2023-04-06 DIAGNOSIS — N951 Menopausal and female climacteric states: Secondary | ICD-10-CM | POA: Insufficient documentation

## 2023-04-06 DIAGNOSIS — Z23 Encounter for immunization: Secondary | ICD-10-CM | POA: Diagnosis not present

## 2023-04-06 DIAGNOSIS — Z13220 Encounter for screening for lipoid disorders: Secondary | ICD-10-CM

## 2023-04-06 DIAGNOSIS — Z1321 Encounter for screening for nutritional disorder: Secondary | ICD-10-CM

## 2023-04-06 NOTE — Addendum Note (Signed)
Addended by: Alvina Chou on: 04/06/2023 12:13 PM   Modules accepted: Orders

## 2023-04-06 NOTE — Assessment & Plan Note (Signed)
HIstory of... tolerable control with exercise.

## 2023-04-06 NOTE — Assessment & Plan Note (Signed)
Chronic intermittent She seems to have reactive airways secondary to viral infection.     Using flovent when cold starts.

## 2023-04-06 NOTE — Assessment & Plan Note (Signed)
Eval with labs. 

## 2023-04-06 NOTE — Assessment & Plan Note (Signed)
Tolerable control with pillow.

## 2023-04-06 NOTE — Progress Notes (Signed)
Patient ID: Kim Woodward, female    DOB: 07-Jul-1965, 57 y.o.   MRN: [id number]  This visit was conducted in person.  BP 120/82 (BP Location: Left Arm, Patient Position: Sitting, Cuff Size: Normal)   Pulse 70   Temp 98.4 F (36.9 C) (Oral)   Ht 5' 3.5" (1.613 m)   Wt 134 lb (60.8 kg)   LMP 11/17/2011   SpO2 98%   BMI 23.36 kg/m    CC:  Chief Complaint  Patient presents with   Transitions Of Care    Subjective:   HPI: Kim Woodward is a 57 y.o. female presenting on 04/06/2023 for Transitions Of Care  Previous PCP that is a new goal, FNP Last physical: Patient sees GYN Dr. Jackelyn Knife  History of labral tear in left hip.[facility] Kitchen MRI in past, followed by ORTHO     Pruritus not really better with scabies treatment.   Chronic neck soreness/stiffness, occ headache if sleeping wrong. Has a neck support pillow that helps a lot.  Occ numbness in hands when waking.   Numbspot on left ankle.   Regular exercise.. but on trip last week. Heart healthy diet.  Mother with uterine cancer.  No family history of MS.  Relevant past medical, surgical, family and social history reviewed and updated as indicated. Interim medical history since our last visit reviewed. Allergies and medications reviewed and updated. Outpatient Medications Prior to Visit  Medication Sig Dispense Refill   Ascorbic Acid (VITAMIN C) 1000 MG tablet Take 1,000 mg by mouth daily.     Budesonide 90 MCG/ACT inhaler Inhale 1 puff into the lungs 2 (two) times daily. 1 each 1   Cholecalciferol (VITAMIN D3) 50 MCG (2000 UT) TABS Take 1 tablet by mouth daily.     COLLAGEN PO Take 2 tablets by mouth daily.     COMBIPATCH 0.05-0.14 MG/DAY Place 1 patch onto the skin 2 (two) times a week.      fluticasone (FLONASE) 50 MCG/ACT nasal spray Place 1 spray into both nostrils daily.     levocetirizine (XYZAL) 5 MG tablet Take 5 mg by mouth every evening.     Multiple Vitamin (MULTIVITAMIN) tablet Take 1 tablet by  mouth daily.     permethrin (ELIMITE) 5 % cream Apply to entire skin from chin down to and including toes under fingernails and toenails. Leave on 8-14 hours. Repeat in 1-2 weeks. Reapply to hands after washing. 60 g 0   No facility-administered medications prior to visit.     Per HPI unless specifically indicated in ROS section below Review of Systems  Constitutional:  Negative for fatigue and fever.  HENT:  Negative for congestion.   Eyes:  Negative for pain.  Respiratory:  Negative for cough and shortness of breath.   Cardiovascular:  Negative for chest pain, palpitations and leg swelling.  Gastrointestinal:  Negative for abdominal pain.  Genitourinary:  Negative for dysuria and vaginal bleeding.  Musculoskeletal:  Negative for back pain.  Neurological:  Negative for syncope, light-headedness and headaches.  Psychiatric/Behavioral:  Negative for dysphoric mood.    Objective:  BP 120/82 (BP Location: Left Arm, Patient Position: Sitting, Cuff Size: Normal)   Pulse 70   Temp 98.4 F (36.9 C) (Oral)   Ht 5' 3.5" (1.613 m)   Wt 134 lb (60.8 kg)   LMP 11/17/2011   SpO2 98%   BMI 23.36 kg/m   Wt Readings from Last 3 Encounters:  04/06/23 134 lb (60.8 kg)  03/02/23 134  lb (60.8 kg)  09/10/22 137 lb 8 oz (62.4 kg)      Physical Exam Constitutional:      General: She is not in acute distress.    Appearance: Normal appearance. She is well-developed. She is not ill-appearing or toxic-appearing.  HENT:     Head: Normocephalic.     Right Ear: Hearing, tympanic membrane, ear canal and external ear normal. Tympanic membrane is not erythematous, retracted or bulging.     Left Ear: Hearing, tympanic membrane, ear canal and external ear normal. Tympanic membrane is not erythematous, retracted or bulging.     Nose: No mucosal edema or rhinorrhea.     Right Sinus: No maxillary sinus tenderness or frontal sinus tenderness.     Left Sinus: No maxillary sinus tenderness or frontal sinus  tenderness.     Mouth/Throat:     Mouth: Oropharynx is clear and moist and mucous membranes are normal.     Pharynx: Uvula midline.  Eyes:     General: Lids are normal. Lids are everted, no foreign bodies appreciated.     Extraocular Movements: EOM normal.     Conjunctiva/sclera: Conjunctivae normal.     Pupils: Pupils are equal, round, and reactive to light.  Neck:     Thyroid: No thyroid mass or thyromegaly.     Vascular: No carotid bruit.     Trachea: Trachea normal.  Cardiovascular:     Rate and Rhythm: Normal rate and regular rhythm.     Pulses: Normal pulses.     Heart sounds: Normal heart sounds, S1 normal and S2 normal. No murmur heard.    No friction rub. No gallop.  Pulmonary:     Effort: Pulmonary effort is normal. No tachypnea or respiratory distress.     Breath sounds: Normal breath sounds. No decreased breath sounds, wheezing, rhonchi or rales.  Abdominal:     General: Bowel sounds are normal.     Palpations: Abdomen is soft.     Tenderness: There is no abdominal tenderness.  Musculoskeletal:     Cervical back: Normal range of motion and neck supple.     Comments: Negative Spurling bilaterally Negative Tinel and Phalen bilaterally   Skin:    General: Skin is warm, dry and intact.     Findings: No rash.  Neurological:     Mental Status: She is alert and oriented to person, place, and time.     GCS: GCS eye subscore is 4. GCS verbal subscore is 5. GCS motor subscore is 6.     Cranial Nerves: No cranial nerve deficit.     Sensory: No sensory deficit.     Motor: No abnormal muscle tone.     Coordination: Coordination normal.     Gait: Gait normal.     Deep Tendon Reflexes: Reflexes are normal and symmetric.     Comments: Small area of focal numbness on left lateral foot  Nml cerebellar exam   No papilledema  Psychiatric:        Mood and Affect: Mood and affect normal. Mood is not anxious or depressed.        Speech: Speech normal.        Behavior: Behavior  normal. Behavior is cooperative.        Thought Content: Thought content normal.        Cognition and Memory: Cognition and memory normal. Memory is not impaired. She does not exhibit impaired recent memory or impaired remote memory.  Judgment: Judgment normal.       Results for orders placed or performed in visit on 04/06/23  HM MAMMOGRAPHY  Result Value Ref Range   HM Mammogram 0-4 Bi-Rad 0-4 Bi-Rad, Self Reported Normal    Assessment and Plan  Encounter for immunization -     Flu vaccine trivalent PF, 6mos and older(Flulaval,Afluria,Fluarix,Fluzone)  Tear of left acetabular labrum, subsequent encounter Assessment & Plan:  HIstory of... tolerable control with exercise.   Mild intermittent reactive airway disease without complication Assessment & Plan: Chronic intermittent She seems to have reactive airways secondary to viral infection.     Using flovent when cold starts.   Chronic neck pain Assessment & Plan:  Tolerable control with pillow.    Screening cholesterol level -     Lipid panel; Future -     Comprehensive metabolic panel; Future  Numbness Assessment & Plan:  Eval with labs  Orders: -     Vitamin B12; Future -     CBC with Differential/Platelet -     TSH; Future  Encounter for vitamin deficiency screening -     VITAMIN D 25 Hydroxy (Vit-D Deficiency, Fractures); Future  Menopause syndrome Assessment & Plan:  On estrogen patch per GYN.   Family history of uterine cancer    Return in about 3 months (around 07/07/2023) for annual physical with fasting labs prior.   Kerby Nora, MD

## 2023-04-06 NOTE — Assessment & Plan Note (Signed)
On estrogen patch per GYN.

## 2023-07-04 ENCOUNTER — Other Ambulatory Visit: Payer: BC Managed Care – PPO

## 2023-07-08 ENCOUNTER — Encounter: Payer: BC Managed Care – PPO | Admitting: Family Medicine

## 2023-09-30 ENCOUNTER — Other Ambulatory Visit (INDEPENDENT_AMBULATORY_CARE_PROVIDER_SITE_OTHER): Payer: BC Managed Care – PPO

## 2023-09-30 ENCOUNTER — Other Ambulatory Visit: Payer: BC Managed Care – PPO

## 2023-09-30 ENCOUNTER — Encounter: Payer: Self-pay | Admitting: Family Medicine

## 2023-09-30 DIAGNOSIS — R200 Anesthesia of skin: Secondary | ICD-10-CM | POA: Diagnosis not present

## 2023-09-30 DIAGNOSIS — Z1321 Encounter for screening for nutritional disorder: Secondary | ICD-10-CM | POA: Diagnosis not present

## 2023-09-30 LAB — CBC WITH DIFFERENTIAL/PLATELET
Basophils Absolute: 0 10*3/uL (ref 0.0–0.1)
Basophils Relative: 0.5 % (ref 0.0–3.0)
Eosinophils Absolute: 0.1 10*3/uL (ref 0.0–0.7)
Eosinophils Relative: 1.6 % (ref 0.0–5.0)
HCT: 43.8 % (ref 36.0–46.0)
Hemoglobin: 14.9 g/dL (ref 12.0–15.0)
Lymphocytes Relative: 30.3 % (ref 12.0–46.0)
Lymphs Abs: 1.8 10*3/uL (ref 0.7–4.0)
MCHC: 34 g/dL (ref 30.0–36.0)
MCV: 88.6 fl (ref 78.0–100.0)
Monocytes Absolute: 0.4 10*3/uL (ref 0.1–1.0)
Monocytes Relative: 6.3 % (ref 3.0–12.0)
Neutro Abs: 3.7 10*3/uL (ref 1.4–7.7)
Neutrophils Relative %: 61.3 % (ref 43.0–77.0)
Platelets: 226 10*3/uL (ref 150.0–400.0)
RBC: 4.95 Mil/uL (ref 3.87–5.11)
RDW: 13.1 % (ref 11.5–15.5)
WBC: 6 10*3/uL (ref 4.0–10.5)

## 2023-09-30 LAB — VITAMIN B12: Vitamin B-12: 672 pg/mL (ref 211–911)

## 2023-09-30 LAB — TSH: TSH: 1.01 u[IU]/mL (ref 0.35–5.50)

## 2023-09-30 LAB — VITAMIN D 25 HYDROXY (VIT D DEFICIENCY, FRACTURES): VITD: 59.1 ng/mL (ref 30.00–100.00)

## 2023-10-07 ENCOUNTER — Ambulatory Visit (INDEPENDENT_AMBULATORY_CARE_PROVIDER_SITE_OTHER): Payer: BC Managed Care – PPO | Admitting: Family Medicine

## 2023-10-07 ENCOUNTER — Encounter: Payer: Self-pay | Admitting: Family Medicine

## 2023-10-07 VITALS — BP 90/60 | HR 73 | Temp 98.0°F | Ht 63.0 in | Wt 136.0 lb

## 2023-10-07 DIAGNOSIS — Z23 Encounter for immunization: Secondary | ICD-10-CM | POA: Diagnosis not present

## 2023-10-07 DIAGNOSIS — Z13220 Encounter for screening for lipoid disorders: Secondary | ICD-10-CM | POA: Diagnosis not present

## 2023-10-07 DIAGNOSIS — Z1211 Encounter for screening for malignant neoplasm of colon: Secondary | ICD-10-CM

## 2023-10-07 DIAGNOSIS — Z0000 Encounter for general adult medical examination without abnormal findings: Secondary | ICD-10-CM | POA: Diagnosis not present

## 2023-10-07 DIAGNOSIS — Z1159 Encounter for screening for other viral diseases: Secondary | ICD-10-CM | POA: Diagnosis not present

## 2023-10-07 NOTE — Addendum Note (Signed)
 Addended by: Damita Lack on: 10/07/2023 09:42 AM   Modules accepted: Orders

## 2023-10-07 NOTE — Progress Notes (Signed)
 Patient ID: Kim Woodward, female    DOB: 29-Jun-1966, 58 y.o.   MRN: [id number]  This visit was conducted in person.  BP 90/60 (BP Location: Left Arm, Patient Position: Sitting, Cuff Size: Normal)   Pulse 73   Temp 98 F (36.7 C) (Temporal)   Ht 5\' 3"  (1.6 m)   Wt 136 lb (61.7 kg)   LMP 11/17/2011   SpO2 98%   BMI 24.09 kg/m    CC:  Chief Complaint  Patient presents with   Annual Exam    Subjective:   HPI: Kim Woodward is a 58 y.o. female presenting on 10/07/2023 for Annual Exam  The patient presents for complete physical and review of chronic health problems. He/She also has the following acute concerns today:  Last physical: Patient sees GYN Dr. Jackelyn Knife  Recent labs reviewed in detail with patient... Normal B12, normal TSH, vitamin D and no anemia.  No clear cause of numbness seen. Unfortunately with these recent labs no lipid panel, c-Met or hepatitis C was checked.  History of labral tear in left hip.Marland Kitchen MRI in past, followed by ORTHO    Chronic neck soreness/stiffness, occ headache if sleeping wrong. Has a neck support pillow that helps a lot.   Numbness, itching resolved on its own.  Regular exercise... 3 times a week 20-30 min. Heart healthy diet.  Mother with uterine cancer.  No family history of MS.  Relevant past medical, surgical, family and social history reviewed and updated as indicated. Interim medical history since our last visit reviewed. Allergies and medications reviewed and updated. Outpatient Medications Prior to Visit  Medication Sig Dispense Refill   Ascorbic Acid (VITAMIN C) 1000 MG tablet Take 1,000 mg by mouth daily.     Budesonide 90 MCG/ACT inhaler Inhale 1 puff into the lungs 2 (two) times daily. 1 each 1   Cholecalciferol (VITAMIN D3) 50 MCG (2000 UT) TABS Take 1 tablet by mouth daily.     COLLAGEN PO Take 2 tablets by mouth daily.     COMBIPATCH 0.05-0.14 MG/DAY Place 1 patch onto the skin 2 (two) times a week.       fluticasone (FLONASE) 50 MCG/ACT nasal spray Place 1 spray into both nostrils daily.     levocetirizine (XYZAL) 5 MG tablet Take 5 mg by mouth every evening.     Multiple Vitamin (MULTIVITAMIN) tablet Take 1 tablet by mouth daily.     Polyethylene Glycol 3350 (MIRALAX PO) Take 1 Scoop by mouth daily.     permethrin (ELIMITE) 5 % cream Apply to entire skin from chin down to and including toes under fingernails and toenails. Leave on 8-14 hours. Repeat in 1-2 weeks. Reapply to hands after washing. 60 g 0   No facility-administered medications prior to visit.     Per HPI unless specifically indicated in ROS section below Review of Systems  Constitutional:  Negative for fatigue and fever.  HENT:  Negative for congestion.   Eyes:  Negative for pain.  Respiratory:  Negative for cough and shortness of breath.   Cardiovascular:  Negative for chest pain, palpitations and leg swelling.  Gastrointestinal:  Negative for abdominal pain.  Genitourinary:  Negative for dysuria and vaginal bleeding.  Musculoskeletal:  Negative for back pain.  Neurological:  Negative for syncope, light-headedness and headaches.  Psychiatric/Behavioral:  Negative for dysphoric mood.    Objective:  BP 90/60 (BP Location: Left Arm, Patient Position: Sitting, Cuff Size: Normal)   Pulse 73   Temp  98 F (36.7 C) (Temporal)   Ht 5\' 3"  (1.6 m)   Wt 136 lb (61.7 kg)   LMP 11/17/2011   SpO2 98%   BMI 24.09 kg/m   Wt Readings from Last 3 Encounters:  10/07/23 136 lb (61.7 kg)  04/06/23 134 lb (60.8 kg)  03/02/23 134 lb (60.8 kg)      Physical Exam Constitutional:      General: She is not in acute distress.    Appearance: Normal appearance. She is well-developed. She is not ill-appearing or toxic-appearing.  HENT:     Head: Normocephalic.     Right Ear: Hearing, tympanic membrane, ear canal and external ear normal. Tympanic membrane is not erythematous, retracted or bulging.     Left Ear: Hearing, tympanic membrane,  ear canal and external ear normal. Tympanic membrane is not erythematous, retracted or bulging.     Nose: No mucosal edema or rhinorrhea.     Right Sinus: No maxillary sinus tenderness or frontal sinus tenderness.     Left Sinus: No maxillary sinus tenderness or frontal sinus tenderness.     Mouth/Throat:     Pharynx: Uvula midline.     Comments: Small cyst in left cheek, mobile, nontender... similar to benign cyst in past Eyes:     General: Lids are normal. Lids are everted, no foreign bodies appreciated.     Conjunctiva/sclera: Conjunctivae normal.     Pupils: Pupils are equal, round, and reactive to light.  Neck:     Thyroid: No thyroid mass or thyromegaly.     Vascular: No carotid bruit.     Trachea: Trachea normal.  Cardiovascular:     Rate and Rhythm: Normal rate and regular rhythm.     Pulses: Normal pulses.     Heart sounds: Normal heart sounds, S1 normal and S2 normal. No murmur heard.    No friction rub. No gallop.  Pulmonary:     Effort: Pulmonary effort is normal. No tachypnea or respiratory distress.     Breath sounds: Normal breath sounds. No decreased breath sounds, wheezing, rhonchi or rales.  Abdominal:     General: Bowel sounds are normal.     Palpations: Abdomen is soft.     Tenderness: There is no abdominal tenderness.  Musculoskeletal:     Cervical back: Normal range of motion and neck supple.     Comments:    Skin:    General: Skin is warm and dry.     Findings: No rash.  Neurological:     Mental Status: She is alert and oriented to person, place, and time.     GCS: GCS eye subscore is 4. GCS verbal subscore is 5. GCS motor subscore is 6.     Cranial Nerves: No cranial nerve deficit.     Sensory: No sensory deficit.     Motor: No abnormal muscle tone.     Coordination: Coordination normal.     Gait: Gait normal.     Deep Tendon Reflexes: Reflexes are normal and symmetric.  Psychiatric:        Mood and Affect: Mood is not anxious or depressed.         Speech: Speech normal.        Behavior: Behavior normal. Behavior is cooperative.        Thought Content: Thought content normal.        Cognition and Memory: Memory is not impaired. She does not exhibit impaired recent memory or impaired remote memory.  Judgment: Judgment normal.       Results for orders placed or performed in visit on 09/30/23  CBC with Differential/Platelet   Collection Time: 09/30/23  8:42 AM  Result Value Ref Range   WBC 6.0 4.0 - 10.5 K/uL   RBC 4.95 3.87 - 5.11 Mil/uL   Hemoglobin 14.9 12.0 - 15.0 g/dL   HCT 16.1 09.6 - 04.5 %   MCV 88.6 78.0 - 100.0 fl   MCHC 34.0 30.0 - 36.0 g/dL   RDW 40.9 81.1 - 91.4 %   Platelets 226.0 150.0 - 400.0 K/uL   Neutrophils Relative % 61.3 43.0 - 77.0 %   Lymphocytes Relative 30.3 12.0 - 46.0 %   Monocytes Relative 6.3 3.0 - 12.0 %   Eosinophils Relative 1.6 0.0 - 5.0 %   Basophils Relative 0.5 0.0 - 3.0 %   Neutro Abs 3.7 1.4 - 7.7 K/uL   Lymphs Abs 1.8 0.7 - 4.0 K/uL   Monocytes Absolute 0.4 0.1 - 1.0 K/uL   Eosinophils Absolute 0.1 0.0 - 0.7 K/uL   Basophils Absolute 0.0 0.0 - 0.1 K/uL  VITAMIN D 25 Hydroxy (Vit-D Deficiency, Fractures)   Collection Time: 09/30/23  8:42 AM  Result Value Ref Range   VITD 59.10 30.00 - 100.00 ng/mL  TSH   Collection Time: 09/30/23  8:42 AM  Result Value Ref Range   TSH 1.01 0.35 - 5.50 uIU/mL  Vitamin B12   Collection Time: 09/30/23  8:42 AM  Result Value Ref Range   Vitamin B-12 672 211 - 911 pg/mL    Assessment and Plan The patient's preventative maintenance and recommended screening tests for an annual wellness exam were reviewed in full today. Brought up to date unless services declined.  Counselled on the importance of diet, exercise, and its role in overall health and mortality. The patient's FH and SH was reviewed, including their home life, tobacco status, and drug and alcohol status.   Vaccines: Due for Tdap, Shingrix, Pneumovax ( given reactive airway, but  this is mild, will hold off), discussed in detail with patient. Pap/DVE: Normal May 02, 2019 followed by gynecology Mammo: Normal 06/16/2022, followed by gynecology Bone Density: not  directed Colon: Overdue Smoking Status: None ETOH/ drug use:  rare/none  Hep C: Due  HIV screen: Due  Routine general medical examination at a health care facility  Screening cholesterol level -     Comprehensive metabolic panel with GFR; Future -     Lipid panel; Future  Need for hepatitis C screening test -     Hepatitis C antibody; Future  Colon cancer screening -     Ambulatory referral to Gastroenterology     Return in about 4 weeks (around 11/04/2023) for lab only.   Kerby Nora, MD

## 2023-10-24 ENCOUNTER — Ambulatory Visit: Payer: Self-pay | Admitting: *Deleted

## 2023-10-24 NOTE — Telephone Encounter (Signed)
 Noted.

## 2023-10-24 NOTE — Telephone Encounter (Signed)
 Per office- requesting patient triage: Chief Complaint: cough, low grade temp Symptoms: Patient states her symptoms started 4/10- with some throat discomfort, hoarseness- she reports cough and low grade temp since 4/10 also. Sputum is clear- some yellow,thick in am- but gets better throughout the day. Fatigue with continued low grade temp.  Frequency: symptoms started 10/13/23 Pertinent Negatives: Patient denies SOB, wheezing Disposition: [] ED /[] Urgent Care (no appt availability in office) / [x] Appointment(In office/virtual)/ []  [facility]/ [] Home Care/ [] Refused Recommended Disposition /[] [facility]/ []  Follow-up with PCP Additional Notes: Patiwnt has appointment scheduled within the 24 hour disposition- advised keep appointment. Patient is getting ready for out of town trip and wants to make she she is well.

## 2023-10-24 NOTE — Progress Notes (Signed)
 Pt has been having a low grade fever for a week pt has appt 4/22 Tuesday I sent pt to the triage line

## 2023-10-24 NOTE — Telephone Encounter (Signed)
 Reason for Disposition  [1] Continuous (nonstop) coughing interferes with work or school AND [2] no improvement using cough treatment per Care Advice  Answer Assessment - Initial Assessment Questions 1. ONSET: "When did the cough begin?"      4/10- lump in throat- cough, low grade fever 2. SEVERITY: "How bad is the cough today?"      Continued to cough phylum  3. SPUTUM: "Describe the color of your sputum" (none, dry cough; clear, white, yellow, green)     Clear- except in am- yellow/thick 4. HEMOPTYSIS: "Are you coughing up any blood?" If so ask: "How much?" (flecks, streaks, tablespoons, etc.)     np 5. DIFFICULTY BREATHING: "Are you having difficulty breathing?" If Yes, ask: "How bad is it?" (e.g., mild, moderate, severe)    - MILD: No SOB at rest, mild SOB with walking, speaks normally in sentences, can lie down, no retractions, pulse < 100.    - MODERATE: SOB at rest, SOB with minimal exertion and prefers to sit, cannot lie down flat, speaks in phrases, mild retractions, audible wheezing, pulse 100-120.    - SEVERE: Very SOB at rest, speaks in single words, struggling to breathe, sitting hunched forward, retractions, pulse > 120      Fatigue- no SOB  6. FEVER: "Do you have a fever?" If Yes, ask: "What is your temperature, how was it measured, and when did it start?"     Low grade temp- 99.4 7. CARDIAC HISTORY: "Do you have any history of heart disease?" (e.g., heart attack, congestive heart failure)      no 8. LUNG HISTORY: "Do you have any history of lung disease?"  (e.g., pulmonary embolus, asthma, emphysema)     No- bronchitis hx 9. PE RISK FACTORS: "Do you have a history of blood clots?" (or: recent major surgery, recent prolonged travel, bedridden)     no 10. OTHER SYMPTOMS: "Do you have any other symptoms?" (e.g., runny nose, wheezing, chest pain)       Low grade temp 99.4  Protocols used: Cough - Acute Productive-A-AH

## 2023-10-25 ENCOUNTER — Encounter: Payer: Self-pay | Admitting: Family Medicine

## 2023-10-25 ENCOUNTER — Ambulatory Visit: Admitting: Family Medicine

## 2023-10-25 VITALS — BP 100/62 | HR 72 | Temp 97.8°F | Ht 63.0 in | Wt 133.5 lb

## 2023-10-25 DIAGNOSIS — R051 Acute cough: Secondary | ICD-10-CM

## 2023-10-25 NOTE — Progress Notes (Signed)
 Patient ID: Kim Woodward, female    DOB: 21-Jun-1966, 58 y.o.   MRN: [id number]  This visit was conducted in person.  BP 100/62 (BP Location: Left Arm, Patient Position: Sitting, Cuff Size: Normal)   Pulse 72   Temp 97.8 F (36.6 C) (Temporal)   Ht 5\' 3"  (1.6 m)   Wt 133 lb 8 oz (60.6 kg)   LMP 11/17/2011   SpO2 97%   BMI 23.65 kg/m    CC:  Chief Complaint  Patient presents with   Cough    Productive x 1 week   Fever    Low-Grade   Fatigue    Subjective:   HPI: Kim Woodward is a 58 y.o. female presenting on 10/25/2023 for Cough (Productive x 1 week), Fever (Low-Grade), and Fatigue   Date of onset: 10/13/2023 Initial symptoms included  ST, clearing throat. Symptoms progressed to low grade temp,  productive cough  Chest heaviness at times.  Ear pressure  No SOB, no wheeze She has started improving yesterday.  Cough at night, but not keeping her up.   Sick contacts:  none COVID testing:   none     She has tried to treat with  Mucinex antihistamine for allergies.    Has history of reactive airway disease.  Non-smoker.       Relevant past medical, surgical, family and social history reviewed and updated as indicated. Interim medical history since our last visit reviewed. Allergies and medications reviewed and updated. Outpatient Medications Prior to Visit  Medication Sig Dispense Refill   Ascorbic Acid (VITAMIN C) 1000 MG tablet Take 1,000 mg by mouth daily.     Budesonide  90 MCG/ACT inhaler Inhale 1 puff into the lungs 2 (two) times daily. 1 each 1   Cholecalciferol (VITAMIN D3) 50 MCG (2000 UT) TABS Take 1 tablet by mouth daily.     COLLAGEN PO Take 2 tablets by mouth daily.     COMBIPATCH 0.05-0.14 MG/DAY Place 1 patch onto the skin 2 (two) times a week.      fluticasone (FLONASE) 50 MCG/ACT nasal spray Place 1 spray into both nostrils daily.     levocetirizine (XYZAL) 5 MG tablet Take 5 mg by mouth every evening.     Multiple Vitamin  (MULTIVITAMIN) tablet Take 1 tablet by mouth daily.     Polyethylene Glycol 3350 (MIRALAX PO) Take 1 Scoop by mouth daily.     No facility-administered medications prior to visit.     Per HPI unless specifically indicated in ROS section below Review of Systems  Constitutional:  Positive for fatigue and fever.  HENT:  Negative for congestion.   Eyes:  Negative for pain.  Respiratory:  Positive for cough. Negative for shortness of breath.   Cardiovascular:  Negative for chest pain, palpitations and leg swelling.  Gastrointestinal:  Negative for abdominal pain.  Genitourinary:  Negative for dysuria and vaginal bleeding.  Musculoskeletal:  Negative for back pain.  Neurological:  Negative for syncope, light-headedness and headaches.  Psychiatric/Behavioral:  Negative for dysphoric mood.    Objective:  BP 100/62 (BP Location: Left Arm, Patient Position: Sitting, Cuff Size: Normal)   Pulse 72   Temp 97.8 F (36.6 C) (Temporal)   Ht 5\' 3"  (1.6 m)   Wt 133 lb 8 oz (60.6 kg)   LMP 11/17/2011   SpO2 97%   BMI 23.65 kg/m   Wt Readings from Last 3 Encounters:  10/25/23 133 lb 8 oz (60.6 kg)  10/07/23 136  lb (61.7 kg)  04/06/23 134 lb (60.8 kg)      Physical Exam Constitutional:      General: She is not in acute distress.    Appearance: She is well-developed. She is not ill-appearing or toxic-appearing.  HENT:     Head: Normocephalic.     Right Ear: Hearing, tympanic membrane, ear canal and external ear normal. Tympanic membrane is not erythematous, retracted or bulging.     Left Ear: Hearing, tympanic membrane, ear canal and external ear normal. Tympanic membrane is not erythematous, retracted or bulging.     Nose: Mucosal edema and rhinorrhea present.     Right Sinus: No maxillary sinus tenderness or frontal sinus tenderness.     Left Sinus: No maxillary sinus tenderness or frontal sinus tenderness.     Mouth/Throat:     Pharynx: Uvula midline.  Eyes:     General: Lids are  normal. Lids are everted, no foreign bodies appreciated.     Conjunctiva/sclera: Conjunctivae normal.     Pupils: Pupils are equal, round, and reactive to light.  Neck:     Thyroid : No thyroid  mass or thyromegaly.     Vascular: No carotid bruit.     Trachea: Trachea normal.  Cardiovascular:     Rate and Rhythm: Normal rate and regular rhythm.     Pulses: Normal pulses.     Heart sounds: Normal heart sounds, S1 normal and S2 normal. No murmur heard.    No friction rub. No gallop.  Pulmonary:     Effort: Pulmonary effort is normal. No tachypnea or respiratory distress.     Breath sounds: Normal breath sounds. No decreased breath sounds, wheezing, rhonchi or rales.  Musculoskeletal:     Cervical back: Normal range of motion and neck supple.  Skin:    General: Skin is warm and dry.     Findings: No rash.  Neurological:     Mental Status: She is alert.  Psychiatric:        Mood and Affect: Mood is not anxious or depressed.        Speech: Speech normal.        Behavior: Behavior normal. Behavior is cooperative.        Judgment: Judgment normal.       Results for orders placed or performed in visit on 09/30/23  CBC with Differential/Platelet   Collection Time: 09/30/23  8:42 AM  Result Value Ref Range   WBC 6.0 4.0 - 10.5 K/uL   RBC 4.95 3.87 - 5.11 Mil/uL   Hemoglobin 14.9 12.0 - 15.0 g/dL   HCT 86.5 78.4 - 69.6 %   MCV 88.6 78.0 - 100.0 fl   MCHC 34.0 30.0 - 36.0 g/dL   RDW 29.5 28.4 - 13.2 %   Platelets 226.0 150.0 - 400.0 K/uL   Neutrophils Relative % 61.3 43.0 - 77.0 %   Lymphocytes Relative 30.3 12.0 - 46.0 %   Monocytes Relative 6.3 3.0 - 12.0 %   Eosinophils Relative 1.6 0.0 - 5.0 %   Basophils Relative 0.5 0.0 - 3.0 %   Neutro Abs 3.7 1.4 - 7.7 K/uL   Lymphs Abs 1.8 0.7 - 4.0 K/uL   Monocytes Absolute 0.4 0.1 - 1.0 K/uL   Eosinophils Absolute 0.1 0.0 - 0.7 K/uL   Basophils Absolute 0.0 0.0 - 0.1 K/uL  VITAMIN D  25 Hydroxy (Vit-D Deficiency, Fractures)    Collection Time: 09/30/23  8:42 AM  Result Value Ref Range   VITD 59.10 30.00 -  100.00 ng/mL  TSH   Collection Time: 09/30/23  8:42 AM  Result Value Ref Range   TSH 1.01 0.35 - 5.50 uIU/mL  Vitamin B12   Collection Time: 09/30/23  8:42 AM  Result Value Ref Range   Vitamin B-12 672 211 - 911 pg/mL    Assessment and Plan  Acute cough Assessment & Plan: Acute, significant improvement in the last 24 hours.  Her symptoms appear to be likely viral versus allergies and she has now started to improve.  She will continue symptomatic care. She does have some history of reactive airway disease but is not currently having any issues with wheezing.  She will continue her current regimen.  Return and ER precautions provided     No follow-ups on file.   Herby Lolling, MD

## 2023-10-31 DIAGNOSIS — R051 Acute cough: Secondary | ICD-10-CM | POA: Insufficient documentation

## 2023-10-31 NOTE — Assessment & Plan Note (Signed)
 Acute, significant improvement in the last 24 hours.  Her symptoms appear to be likely viral versus allergies and she has now started to improve.  She will continue symptomatic care. She does have some history of reactive airway disease but is not currently having any issues with wheezing.  She will continue her current regimen.  Return and ER precautions provided

## 2024-04-26 ENCOUNTER — Ambulatory Visit: Admitting: Family Medicine

## 2024-04-26 VITALS — BP 130/78 | HR 81 | Temp 98.7°F | Ht 63.0 in | Wt 138.2 lb

## 2024-04-26 DIAGNOSIS — Z13220 Encounter for screening for lipoid disorders: Secondary | ICD-10-CM

## 2024-04-26 DIAGNOSIS — Z1159 Encounter for screening for other viral diseases: Secondary | ICD-10-CM

## 2024-04-26 DIAGNOSIS — R519 Headache, unspecified: Secondary | ICD-10-CM | POA: Diagnosis not present

## 2024-04-26 NOTE — Progress Notes (Signed)
 Patient ID: Kim Woodward, female    DOB: 28-Jun-1966, 58 y.o.   MRN: [id number]  This visit was conducted in person.  BP 130/78   Pulse 81   Temp 98.7 F (37.1 C) (Temporal)   Ht 5' 3 (1.6 m)   Wt 138 lb 4 oz (62.7 kg)   LMP 11/17/2011   SpO2 97%   BMI 24.49 kg/m    CC:  Chief Complaint  Patient presents with   Headache    Different HA from she normally has     Subjective:   HPI: Kim Woodward is a 58 y.o. 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  New onset headache in last 24 hours.  Soreness to touch on bilateral scalp.. worse right greater than left.  Feels prominent vessel on right scalp... pain not on temples  Continuous pain, somewhat better than yesterday. Describes as pressure in both sides of head.  No vision change.  Feels foggy.  No typical migraine aura  No associated nausea, she is slightly sensitive to light, no phonophobia.  No numbness, no weakness. No slrred speech.  No new shoulder and hia pain... does shave some chronic shoulder /hip pain.  No recent fall or head injury       Relevant past medical, surgical, family and social history reviewed and updated as indicated. Interim medical history since our last visit reviewed. Allergies and medications reviewed and updated. Outpatient Medications Prior to Visit  Medication Sig Dispense Refill   Ascorbic Acid (VITAMIN C) 1000 MG tablet Take 1,000 mg by mouth daily.     Budesonide  90 MCG/ACT inhaler Inhale 1 puff into the lungs 2 (two) times daily. 1 each 1   Cholecalciferol (VITAMIN D3) 50 MCG (2000 UT) TABS Take 1 tablet by mouth daily.     COLLAGEN PO Take 2 tablets by mouth daily.     COMBIPATCH 0.05-0.14 MG/DAY  Place 1 patch onto the skin 2 (two) times a week.      fluticasone (FLONASE) 50 MCG/ACT nasal spray Place 1 spray into both nostrils daily.     levocetirizine (XYZAL) 5 MG tablet Take 5 mg by mouth every evening.     Multiple Vitamin (MULTIVITAMIN) tablet Take 1 tablet by mouth daily.     Polyethylene Glycol 3350 (MIRALAX PO) Take 1 Scoop by mouth daily.     No facility-administered medications prior to visit.     Per HPI unless specifically indicated in ROS section below Review of Systems  Constitutional:  Negative for fatigue and fever.  HENT:  Negative for congestion.   Eyes:  Negative for pain.  Respiratory:  Negative for cough and shortness of breath.   Cardiovascular:  Negative for chest pain, palpitations and leg swelling.  Gastrointestinal:  Negative for abdominal pain.  Genitourinary:  Negative for dysuria and vaginal bleeding.  Musculoskeletal:  Negative for back pain.  Neurological:  Positive for headaches. Negative for syncope and light-headedness.  Psychiatric/Behavioral:  Negative for dysphoric mood.    Objective:  BP 130/78   Pulse 81   Temp 98.7 F (37.1 C) (Temporal)   Ht 5' 3 (1.6 m)   Wt 138 lb 4 oz (62.7 kg)   LMP 11/17/2011   SpO2 97%  BMI 24.49 kg/m   Wt Readings from Last 3 Encounters:  04/26/24 138 lb 4 oz (62.7 kg)  10/25/23 133 lb 8 oz (60.6 kg)  10/07/23 136 lb (61.7 kg)      Physical Exam Constitutional:      General: She is not in acute distress.    Appearance: Normal appearance. She is well-developed. She is not ill-appearing or toxic-appearing.  HENT:     Head: Normocephalic.     Right Ear: Hearing, tympanic membrane, ear canal and external ear normal. Tympanic membrane is not erythematous, retracted or bulging.     Left Ear: Hearing, tympanic membrane, ear canal and external ear normal. Tympanic membrane is not erythematous, retracted or bulging.     Nose: No mucosal edema or rhinorrhea.     Right Sinus: No maxillary sinus tenderness  or frontal sinus tenderness.     Left Sinus: No maxillary sinus tenderness or frontal sinus tenderness.     Mouth/Throat:     Pharynx: Uvula midline.  Eyes:     General: Lids are normal. Lids are everted, no foreign bodies appreciated.     Conjunctiva/sclera: Conjunctivae normal.     Pupils: Pupils are equal, round, and reactive to light.  Neck:     Thyroid : No thyroid  mass or thyromegaly.     Vascular: No carotid bruit.     Trachea: Trachea normal.  Cardiovascular:     Rate and Rhythm: Normal rate and regular rhythm.     Pulses: Normal pulses.     Heart sounds: Normal heart sounds, S1 normal and S2 normal. No murmur heard.    No friction rub. No gallop.  Pulmonary:     Effort: Pulmonary effort is normal. No tachypnea or respiratory distress.     Breath sounds: Normal breath sounds. No decreased breath sounds, wheezing, rhonchi or rales.  Abdominal:     General: Bowel sounds are normal.     Palpations: Abdomen is soft.     Tenderness: There is no abdominal tenderness.  Musculoskeletal:     Cervical back: Normal range of motion and neck supple.  Skin:    General: Skin is warm and dry.     Findings: No rash.     Comments:  Scalp tenderness on bilateral lateral scalp... no prominent vessel or cyst noted.  No blisters,  Neurological:     Mental Status: She is alert and oriented to person, place, and time.     GCS: GCS eye subscore is 4. GCS verbal subscore is 5. GCS motor subscore is 6.     Cranial Nerves: No cranial nerve deficit.     Sensory: No sensory deficit.     Motor: No abnormal muscle tone.     Coordination: Coordination normal.     Gait: Gait normal.     Deep Tendon Reflexes: Reflexes are normal and symmetric.     Comments: Nml cerebellar exam   No papilledema  Psychiatric:        Mood and Affect: Mood is not anxious or depressed.        Speech: Speech normal.        Behavior: Behavior normal. Behavior is cooperative.        Thought Content: Thought content  normal.        Cognition and Memory: Memory is not impaired. She does not exhibit impaired recent memory or impaired remote memory.        Judgment: Judgment normal.       Results for orders  placed or performed in visit on 09/30/23  CBC with Differential/Platelet   Collection Time: 09/30/23  8:42 AM  Result Value Ref Range   WBC 6.0 4.0 - 10.5 K/uL   RBC 4.95 3.87 - 5.11 Mil/uL   Hemoglobin 14.9 12.0 - 15.0 g/dL   HCT 56.1 63.9 - 53.9 %   MCV 88.6 78.0 - 100.0 fl   MCHC 34.0 30.0 - 36.0 g/dL   RDW 86.8 88.4 - 84.4 %   Platelets 226.0 150.0 - 400.0 K/uL   Neutrophils Relative % 61.3 43.0 - 77.0 %   Lymphocytes Relative 30.3 12.0 - 46.0 %   Monocytes Relative 6.3 3.0 - 12.0 %   Eosinophils Relative 1.6 0.0 - 5.0 %   Basophils Relative 0.5 0.0 - 3.0 %   Neutro Abs 3.7 1.4 - 7.7 K/uL   Lymphs Abs 1.8 0.7 - 4.0 K/uL   Monocytes Absolute 0.4 0.1 - 1.0 K/uL   Eosinophils Absolute 0.1 0.0 - 0.7 K/uL   Basophils Absolute 0.0 0.0 - 0.1 K/uL  VITAMIN D  25 Hydroxy (Vit-D Deficiency, Fractures)   Collection Time: 09/30/23  8:42 AM  Result Value Ref Range   VITD 59.10 30.00 - 100.00 ng/mL  TSH   Collection Time: 09/30/23  8:42 AM  Result Value Ref Range   TSH 1.01 0.35 - 5.50 uIU/mL  Vitamin B12   Collection Time: 09/30/23  8:42 AM  Result Value Ref Range   Vitamin B-12 672 211 - 911 pg/mL    Assessment and Plan  Acute intractable headache, unspecified headache type Assessment & Plan: Acute, intractable atypical headache and this patient with history of migraine. Possible atypical migraine versus tension headache versus temporal arteritis. Not clearly suggestive of temporal arteritis given bilateral and further back on scalp than temples. She does have some chronic hip and shoulder pain but no current stiffness.  No clear suggestion of PMR. No acute visual change.  No red flags.  Will evaluate with sed rate and CRP.    Return and ER precautions provided.   Orders: -      Sedimentation rate -     C-reactive protein  Screening cholesterol level -     Lipid panel; Future  Need for hepatitis C screening test -     Hepatitis C antibody; Future    No follow-ups on file.   Greig Ring, MD

## 2024-04-26 NOTE — Assessment & Plan Note (Signed)
 Acute, intractable atypical headache and this patient with history of migraine. Possible atypical migraine versus tension headache versus temporal arteritis. Not clearly suggestive of temporal arteritis given bilateral and further back on scalp than temples. She does have some chronic hip and shoulder pain but no current stiffness.  No clear suggestion of PMR. No acute visual change.  No red flags.  Will evaluate with sed rate and CRP.    Return and ER precautions provided.

## 2024-04-27 ENCOUNTER — Ambulatory Visit: Payer: Self-pay | Admitting: Family Medicine

## 2024-04-27 LAB — C-REACTIVE PROTEIN: CRP: <0.5 mg/dL (ref 0.5–20.0)

## 2024-04-27 LAB — SEDIMENTATION RATE: Sed Rate: 11 mm/h (ref 0–30)

## 2024-05-25 ENCOUNTER — Other Ambulatory Visit (INDEPENDENT_AMBULATORY_CARE_PROVIDER_SITE_OTHER)

## 2024-05-25 DIAGNOSIS — Z1159 Encounter for screening for other viral diseases: Secondary | ICD-10-CM | POA: Diagnosis not present

## 2024-05-25 DIAGNOSIS — Z13220 Encounter for screening for lipoid disorders: Secondary | ICD-10-CM

## 2024-05-25 LAB — LIPID PANEL
Cholesterol: 232 mg/dL — ABNORMAL HIGH (ref 0–200)
HDL: 68.1 mg/dL (ref >39.00)
LDL Cholesterol: 147 mg/dL — ABNORMAL HIGH (ref 0–99)
NonHDL: 164.21
Total CHOL/HDL Ratio: 3
Triglycerides: 86 mg/dL (ref 0.0–149.0)
VLDL: 17.2 mg/dL (ref 0.0–40.0)

## 2024-05-26 LAB — HEPATITIS C ANTIBODY: Hepatitis C Ab: NONREACTIVE

## 2024-05-29 ENCOUNTER — Ambulatory Visit (INDEPENDENT_AMBULATORY_CARE_PROVIDER_SITE_OTHER): Admitting: *Deleted

## 2024-05-29 DIAGNOSIS — Z23 Encounter for immunization: Secondary | ICD-10-CM

## 2024-05-29 NOTE — Progress Notes (Signed)
 Per orders of Greig Ring, MD injection of Influenza Vaccine given by Manuelita JAYSON Frost in left deltoid. Patient tolerated injection well.
